# Patient Record
Sex: Male | Born: 1975 | Race: White | Hispanic: No | Smoking: Never smoker
Health system: Southern US, Community
[De-identification: ages and names within clinical notes are randomized; demographics above are authoritative.]

## PROBLEM LIST (undated history)

## (undated) DIAGNOSIS — M24419 Recurrent dislocation, unspecified shoulder: Secondary | ICD-10-CM

## (undated) HISTORY — PX: WISDOM TOOTH EXTRACTION: SHX21

---

## 2007-12-12 ENCOUNTER — Emergency Department (HOSPITAL_COMMUNITY): Admission: EM | Admit: 2007-12-12 | Discharge: 2007-12-12 | Payer: Self-pay | Admitting: Family Medicine

## 2009-12-22 ENCOUNTER — Emergency Department (HOSPITAL_BASED_OUTPATIENT_CLINIC_OR_DEPARTMENT_OTHER): Admission: EM | Admit: 2009-12-22 | Discharge: 2009-12-23 | Payer: Self-pay | Admitting: Emergency Medicine

## 2009-12-22 ENCOUNTER — Ambulatory Visit: Payer: Self-pay | Admitting: Diagnostic Radiology

## 2010-01-20 ENCOUNTER — Ambulatory Visit (HOSPITAL_COMMUNITY): Admission: RE | Admit: 2010-01-20 | Discharge: 2010-01-20 | Payer: Self-pay | Admitting: Orthopedic Surgery

## 2010-09-04 HISTORY — PX: SHOULDER ARTHROSCOPY W/ ROTATOR CUFF REPAIR: SHX2400

## 2013-06-25 ENCOUNTER — Ambulatory Visit
Admission: RE | Admit: 2013-06-25 | Discharge: 2013-06-25 | Disposition: A | Discharge status: Home / Self Care | Payer: No Typology Code available for payment source | Source: Ambulatory Visit | Attending: Occupational Medicine | Admitting: Occupational Medicine

## 2013-06-25 ENCOUNTER — Other Ambulatory Visit: Payer: Self-pay | Admitting: Occupational Medicine

## 2013-06-25 DIAGNOSIS — Z021 Encounter for pre-employment examination: Secondary | ICD-10-CM

## 2020-03-16 ENCOUNTER — Emergency Department (HOSPITAL_BASED_OUTPATIENT_CLINIC_OR_DEPARTMENT_OTHER): Payer: 59

## 2020-03-16 ENCOUNTER — Encounter (HOSPITAL_BASED_OUTPATIENT_CLINIC_OR_DEPARTMENT_OTHER): Payer: Self-pay

## 2020-03-16 ENCOUNTER — Other Ambulatory Visit: Payer: Self-pay

## 2020-03-16 ENCOUNTER — Emergency Department (HOSPITAL_BASED_OUTPATIENT_CLINIC_OR_DEPARTMENT_OTHER)
Admission: EM | Admit: 2020-03-16 | Discharge: 2020-03-16 | Disposition: A | Discharge status: Home / Self Care | Payer: 59 | Attending: Emergency Medicine | Admitting: Emergency Medicine

## 2020-03-16 DIAGNOSIS — X501XXA Overexertion from prolonged static or awkward postures, initial encounter: Secondary | ICD-10-CM | POA: Diagnosis not present

## 2020-03-16 DIAGNOSIS — Y939 Activity, unspecified: Secondary | ICD-10-CM | POA: Insufficient documentation

## 2020-03-16 DIAGNOSIS — S4992XA Unspecified injury of left shoulder and upper arm, initial encounter: Secondary | ICD-10-CM | POA: Diagnosis present

## 2020-03-16 DIAGNOSIS — Y999 Unspecified external cause status: Secondary | ICD-10-CM | POA: Diagnosis not present

## 2020-03-16 DIAGNOSIS — Y929 Unspecified place or not applicable: Secondary | ICD-10-CM | POA: Diagnosis not present

## 2020-03-16 NOTE — ED Triage Notes (Signed)
Pt believes left shoulder is dislocated ~1hr ago moving a door jam, good cap refill

## 2020-03-16 NOTE — ED Provider Notes (Signed)
[facility] EMERGENCY DEPARTMENT Provider Note  CSN: [id number] Arrival date & time: 03/16/20 0203  Chief Complaint(s) No chief complaint on file.  HPI Francis Smith is a 44 y.o. male   CC: Shoulder pain  Onset/Duration: Sudden, 2 to 3 hours ago Timing: Constant now resolved Location: Left Quality: Aching Severity: Initially severe Modifying Factors:  Improved by: Immobility  Worsened by: Range of motion palpation of the left shoulder Associated Signs/Symptoms:  Pertinent (+): Reports deformity of the left shoulder  Pertinent (-): Trauma/fall, wounds, numbness, weakness Context: Patient reports that he was pulling on a door jam when he felt the shoulder pop.  Believes it was dislocated.     HPI  Past Medical History History reviewed. No pertinent past medical history. There are no problems to display for this patient.  Home Medication(s) Prior to Admission medications   Not on File                                                                                                                                    Past Surgical History Past Surgical History:  Procedure Laterality Date  . SHOULDER ARTHROSCOPY W/ ROTATOR CUFF REPAIR Right 2012  . WISDOM TOOTH EXTRACTION     Family History History reviewed. No pertinent family history.  Social History Social History   Tobacco Use  . Smoking status: Never Smoker  . Smokeless tobacco: Current User  Substance Use Topics  . Alcohol use: Yes  . Drug use: Never   Allergies Patient has no allergy information on record.  Review of Systems Review of Systems All other systems are reviewed and are negative for acute change except as noted in the HPI  Physical Exam Vital Signs  I have reviewed the triage vital signs BP 128/77 (BP Location: Right Arm)   Pulse 67   Temp 98 F (36.7 C) (Oral)   Resp 15   Ht 6\' 2"  (1.88 m)   Wt (!) 142.4 kg   SpO2 92%   BMI 40.32 kg/m   Physical Exam Vitals  reviewed.  Constitutional:      General: He is not in acute distress.    Appearance: He is well-developed. He is not diaphoretic.  HENT:     Head: Normocephalic and atraumatic.     Jaw: No trismus.     Right Ear: External ear normal.     Left Ear: External ear normal.     Nose: Nose normal.  Eyes:     General: No scleral icterus.    Conjunctiva/sclera: Conjunctivae normal.  Neck:     Trachea: Phonation normal.  Cardiovascular:     Rate and Rhythm: Normal rate and regular rhythm.  Pulmonary:     Effort: Pulmonary effort is normal. No respiratory distress.     Breath sounds: No stridor.  Abdominal:     General: There is no distension.  Musculoskeletal:  General: Normal range of motion.     Left shoulder: No swelling, deformity, tenderness or bony tenderness. Normal range of motion. Normal strength. Normal pulse.     Cervical back: Normal range of motion.  Neurological:     Mental Status: He is alert and oriented to person, place, and time.  Psychiatric:        Behavior: Behavior normal.     ED Results and Treatments Labs (all labs ordered are listed, but only abnormal results are displayed) Labs Reviewed - No data to display                                                                                                                       EKG  EKG Interpretation  Date/Time:    Ventricular Rate:    PR Interval:    QRS Duration:   QT Interval:    QTC Calculation:   R Axis:     Text Interpretation:        Radiology DG Shoulder Left  Result Date: 03/16/2020 CLINICAL DATA:  44 year old male with trauma to the left shoulder. EXAM: LEFT SHOULDER - 2+ VIEW COMPARISON:  Chest radiograph dated 06/25/2013. FINDINGS: Small corticated appearing bone fragments adjacent to the humeral head with no obvious donor site likely represent calcification of the insertion of supraspinatus tendon or small intra-articular loose bodies. No definite acute fracture. There is no  dislocation. The bones are well mineralized. No significant arthritic changes. The soft tissues are unremarkable. IMPRESSION: 1. No definite acute fracture or dislocation. 2. Probable calcification of the insertion of supraspinatus tendon versus intra-articular loose bodies. Electronically Signed   By: Elgie Collard M.D.   On: 03/16/2020 02:49    Pertinent labs & imaging results that were available during my care of the patient were reviewed by me and considered in my medical decision making (see chart for details).  Medications Ordered in ED Medications - No data to display                                                                                                                                  Procedures Procedures  (including critical care time)  Medical Decision Making / ED Course I have reviewed the nursing notes for this encounter and the patient's prior records (if available in EHR or on provided paperwork).   Francis Smith was evaluated in Emergency Department on 03/16/2020 for the symptoms  described in the history of present illness. He was evaluated in the context of the global COVID-19 pandemic, which necessitated consideration that the patient might be at risk for infection with the SARS-CoV-2 virus that causes COVID-19. Institutional protocols and algorithms that pertain to the evaluation of patients at risk for COVID-19 are in a state of rapid change based on information released by regulatory bodies including the CDC and federal and state organizations. These policies and algorithms were followed during the patient's care in the ED.  Plain film negative for dislocation.  Possible avulsion fracture vs rotator cuff injury.  Patient already has an established orthopedic surgeon, recommended follow-up with him. Declined a sling here.      Final Clinical Impression(s) / ED Diagnoses Final diagnoses:  Injury of left shoulder, initial encounter    The patient appears  reasonably screened and/or stabilized for discharge and I doubt any other medical condition or other [facility] requiring further screening, evaluation, or treatment in the ED at this time prior to discharge. Safe for discharge with strict return precautions.  Disposition: Discharge  Condition: Good  I have discussed the results, Dx and Tx plan with the patient/family who expressed understanding and agree(s) with the plan. Discharge instructions discussed at length. The patient/family was given strict return precautions who verbalized understanding of the instructions. No further questions at time of discharge.    ED Discharge Orders    None        Follow Up: [address] [place] [address] [phone]  Schedule an appointment as soon as possible for a visit       This chart was dictated using voice recognition software.  Despite best efforts to proofread,  errors can occur which can change the documentation meaning.   Nira Conn, MD 03/16/20 [phone]

## 2020-03-26 ENCOUNTER — Emergency Department (HOSPITAL_COMMUNITY): Payer: 59

## 2020-03-26 ENCOUNTER — Emergency Department (HOSPITAL_COMMUNITY)
Admission: EM | Admit: 2020-03-26 | Discharge: 2020-03-26 | Disposition: A | Discharge status: Home / Self Care | Payer: 59 | Attending: Emergency Medicine | Admitting: Emergency Medicine

## 2020-03-26 DIAGNOSIS — S43015A Anterior dislocation of left humerus, initial encounter: Secondary | ICD-10-CM | POA: Insufficient documentation

## 2020-03-26 DIAGNOSIS — X58XXXA Exposure to other specified factors, initial encounter: Secondary | ICD-10-CM | POA: Diagnosis not present

## 2020-03-26 DIAGNOSIS — Y999 Unspecified external cause status: Secondary | ICD-10-CM | POA: Diagnosis not present

## 2020-03-26 DIAGNOSIS — Y929 Unspecified place or not applicable: Secondary | ICD-10-CM | POA: Insufficient documentation

## 2020-03-26 DIAGNOSIS — Y939 Activity, unspecified: Secondary | ICD-10-CM | POA: Insufficient documentation

## 2020-03-26 NOTE — ED Notes (Signed)
Patient verbalizes understanding of discharge instructions. Opportunity for questioning and answers were provided. Armband removed by staff, pt discharged from ED ambulatory.   

## 2020-03-26 NOTE — ED Provider Notes (Signed)
[facility] EMERGENCY DEPARTMENT Provider Note   CSN: [id number] Arrival date & time: 03/26/20  1327     History Chief Complaint  Patient presents with  . Shoulder Pain    Francis Smith is a 44 y.o. male.  The history is provided by the patient.  Shoulder Pain Location:  Shoulder (putting on his jacket today when shoulder dislocated) Shoulder location:  L shoulder Injury: no   Pain details:    Quality:  Cramping, shooting and throbbing   Radiates to:  Does not radiate   Severity:  Moderate   Onset quality:  Sudden   Duration:  2 hours   Timing:  Constant   Progression:  Worsening Handedness:  Right-handed Dislocation: yes   Prior injury to area:  Yes Relieved by:  None tried Worsened by:  Movement Ineffective treatments: tried moving the arm to get it back in but not getting better. Associated symptoms: decreased range of motion   Associated symptoms: no numbness, no stiffness and no swelling   Risk factors comment:  Hx of prior dislocation.  last dislocated 2 weeks ago      No past medical history on file.  There are no problems to display for this patient.   Past Surgical History:  Procedure Laterality Date  . SHOULDER ARTHROSCOPY W/ ROTATOR CUFF REPAIR Right 2012  . WISDOM TOOTH EXTRACTION         No family history on file.  Social History   Tobacco Use  . Smoking status: Never Smoker  . Smokeless tobacco: Current User  Substance Use Topics  . Alcohol use: Yes  . Drug use: Never    Home Medications Prior to Admission medications   Not on File    Allergies    Patient has no known allergies.  Review of Systems   Review of Systems  Musculoskeletal: Negative for stiffness.  All other systems reviewed and are negative.   Physical Exam Updated Vital Signs BP (!) 168/106   Pulse 74   Resp 20   SpO2 93%   Physical Exam Vitals and nursing note reviewed.  Constitutional:      General: He is not in acute  distress.    Appearance: Normal appearance. He is obese.  HENT:     Head: Normocephalic and atraumatic.  Eyes:     Pupils: Pupils are equal, round, and reactive to light.  Cardiovascular:     Rate and Rhythm: Normal rate.     Pulses: Normal pulses.  Pulmonary:     Effort: Pulmonary effort is normal.  Musculoskeletal:     Left shoulder: Deformity and bony tenderness present. Decreased range of motion.     Comments: Shoulder sitting lower and leaned forward  Skin:    General: Skin is warm and dry.  Neurological:     General: No focal deficit present.     Mental Status: He is alert and oriented to person, place, and time. Mental status is at baseline.  Psychiatric:        Mood and Affect: Mood normal.        Behavior: Behavior normal.     ED Results / Procedures / Treatments   Labs (all labs ordered are listed, but only abnormal results are displayed) Labs Reviewed - No data to display  EKG None  Radiology DG Shoulder Left  Result Date: 03/26/2020 CLINICAL DATA:  Possible shoulder dislocation. No mechanism of injury given. EXAM: LEFT SHOULDER - 2+ VIEW COMPARISON:  03/16/2020 FINDINGS: Anterior,  inferior dislocation of the humeral head relative to the glenoid. 2 previously demonstrated oval, corticated bone fragments are again demonstrated posteriorly. No acute fracture or dislocation seen. Mild humeral head and neck junction spur formation. Mild inferior acromioclavicular spur formation. IMPRESSION: 1. Anterior dislocation. 2. Two previously demonstrated corticated bone fragments posteriorly. Electronically Signed   By: Beckie Salts M.D.   On: 03/26/2020 14:15   DG Shoulder Left Portable  Result Date: 03/26/2020 CLINICAL DATA:  Status post reduction EXAM: LEFT SHOULDER COMPARISON:  In the same day FINDINGS: Humeral head is been reduced. No acute fracture is noted. Previously seen well corticated bony densities are less well appreciated on this exam. Underlying bony thorax is  within normal limits. IMPRESSION: Interval reduction of previously seen shoulder dislocation Electronically Signed   By: Alcide Clever M.D.   On: 03/26/2020 14:59    Procedures Reduction of dislocation  Date/Time: 03/26/2020 2:27 PM Performed by: Gwyneth Sprout, MD Authorized by: Gwyneth Sprout, MD  Local anesthesia used: no  Anesthesia: Local anesthesia used: no  Sedation: Patient sedated: no  Patient tolerance: patient tolerated the procedure well with no immediate complications Comments: Pt laid prone and with constant traction and shoulder flexion pt relaxed and pop occurred with resolution of pain and return of ROM    (including critical care time)  Medications Ordered in ED Medications - No data to display  ED Course  I have reviewed the triage vital signs and the nursing notes.  Pertinent labs & imaging results that were available during my care of the patient were reviewed by me and considered in my medical decision making (see chart for details).    MDM Rules/Calculators/A&P                          Patient presenting today with recurrent shoulder dislocation.  Patient was just putting on a jacket today when it went out but he was unable to get it back in and is having ongoing pain in his left shoulder.  Patient has had multiple dislocations of the shoulder in the past the last being 2 weeks ago when he jerked on something but was able to get it in at home.  Patient has no neurologic complaints at this time.  X-ray shows anterior dislocation with 2 previously corticated bone fragments posteriorly.  Patient was laid prone and able to reduce at bedside without sedation or pain medication.  Patient is moving his arm through range of motion and reports he feels much better.  We will get postreduction imaging.  3:05 PM Reduction images wnl.  Will d/c home.  MDM Number of Diagnoses or Management Options   Amount and/or Complexity of Data Reviewed Tests in the  radiology section of CPT: ordered and reviewed Independent visualization of images, tracings, or specimens: yes  Risk of Complications, Morbidity, and/or Mortality Presenting problems: moderate Diagnostic procedures: low Management options: low  Patient Progress Patient progress: improved   Final Clinical Impression(s) / ED Diagnoses Final diagnoses:  Anterior dislocation of left shoulder, initial encounter    Rx / DC Orders ED Discharge Orders    None       Gwyneth Sprout, MD 03/26/20 1506

## 2020-03-26 NOTE — Discharge Instructions (Signed)
Avoid lifting your arm directly up from your side like putting on a jacket for the next 2 weeks to prevent recurrent dislocation

## 2020-03-26 NOTE — ED Triage Notes (Signed)
Pt arrives with possible dislocated L shoulder. CNS intact.

## 2020-03-30 ENCOUNTER — Emergency Department (HOSPITAL_COMMUNITY): Payer: 59

## 2020-03-30 ENCOUNTER — Emergency Department (HOSPITAL_COMMUNITY)
Admission: EM | Admit: 2020-03-30 | Discharge: 2020-03-30 | Disposition: A | Discharge status: Home / Self Care | Payer: 59 | Attending: Emergency Medicine | Admitting: Emergency Medicine

## 2020-03-30 ENCOUNTER — Other Ambulatory Visit: Payer: Self-pay

## 2020-03-30 ENCOUNTER — Encounter (HOSPITAL_COMMUNITY): Payer: Self-pay | Admitting: Obstetrics and Gynecology

## 2020-03-30 DIAGNOSIS — X501XXA Overexertion from prolonged static or awkward postures, initial encounter: Secondary | ICD-10-CM | POA: Insufficient documentation

## 2020-03-30 DIAGNOSIS — Y929 Unspecified place or not applicable: Secondary | ICD-10-CM | POA: Insufficient documentation

## 2020-03-30 DIAGNOSIS — Y999 Unspecified external cause status: Secondary | ICD-10-CM | POA: Diagnosis not present

## 2020-03-30 DIAGNOSIS — Y939 Activity, unspecified: Secondary | ICD-10-CM | POA: Insufficient documentation

## 2020-03-30 DIAGNOSIS — S43015A Anterior dislocation of left humerus, initial encounter: Secondary | ICD-10-CM | POA: Insufficient documentation

## 2020-03-30 DIAGNOSIS — S4992XA Unspecified injury of left shoulder and upper arm, initial encounter: Secondary | ICD-10-CM | POA: Diagnosis present

## 2020-03-30 HISTORY — DX: Recurrent dislocation, unspecified shoulder: M24.419

## 2020-03-30 MED ORDER — OXYCODONE-ACETAMINOPHEN 5-325 MG PO TABS
1.0000 | ORAL_TABLET | Freq: Once | ORAL | Status: AC
  Administered 2020-03-30: 1 via ORAL
  Filled 2020-03-30: qty 1

## 2020-03-30 NOTE — ED Notes (Signed)
Spoke with ortho, per ortho pt was seen by there PTA, 4cc lidocaine injected into shoulder and unable to reset at that time.

## 2020-03-30 NOTE — ED Notes (Signed)
Pt discharged from this ED in stable condition at this time. All discharge instructions and follow up care reviewed with pt with no further questions at this time. Pt ambulatory with steady gait, clear speech.  

## 2020-03-30 NOTE — ED Provider Notes (Signed)
[facility]-EMERGENCY DEPT Provider Note   CSN: [id number] Arrival date & time: 03/30/20  0849     History Chief Complaint  Patient presents with  . Shoulder Injury    TOMOKI LUCKEN is a 44 y.o. male.  Presents to ER with concern for shoulder injury.  Patient states this is now his third time dislocating his shoulder over the last couple weeks.  Thinks it was dislocated this morning and sleepy.  Went to [facility] this morning, they were unsuccessful in reducing the shoulder and sent to ER for further evaluation.  Currently having moderate pain, worse with movement, no numbness or weakness.  No other relieving aggravating factors.  Has not taken meds yet.  HPI     Past Medical History:  Diagnosis Date  . Shoulder dislocation, recurrent     There are no problems to display for this patient.   Past Surgical History:  Procedure Laterality Date  . SHOULDER ARTHROSCOPY W/ ROTATOR CUFF REPAIR Right 2012  . WISDOM TOOTH EXTRACTION         No family history on file.  Social History   Tobacco Use  . Smoking status: Never Smoker  . Smokeless tobacco: Current User  Substance Use Topics  . Alcohol use: Yes  . Drug use: Never    Home Medications Prior to Admission medications   Not on File    Allergies    Patient has no known allergies.  Review of Systems   Review of Systems  Constitutional: Negative for chills and fever.  HENT: Negative for ear pain and sore throat.   Eyes: Negative for pain and visual disturbance.  Respiratory: Negative for cough and shortness of breath.   Cardiovascular: Negative for chest pain and palpitations.  Gastrointestinal: Negative for abdominal pain and vomiting.  Genitourinary: Negative for dysuria and hematuria.  Musculoskeletal: Positive for arthralgias. Negative for back pain.  Skin: Negative for color change and rash.  Neurological: Negative for seizures and syncope.  All other systems reviewed and are  negative.    Physical Exam Updated Vital Signs BP (!) 188/125   Pulse 72   Temp 98.4 F (36.9 C) (Oral)   Resp 16   SpO2 99%   Physical Exam Vitals and nursing note reviewed.  HENT:     Head: Normocephalic and atraumatic.     Nose: Nose normal.     Mouth/Throat:     Mouth: Mucous membranes are moist.  Eyes:     Pupils: Pupils are equal, round, and reactive to light.  Cardiovascular:     Rate and Rhythm: Normal rate and regular rhythm.     Pulses: Normal pulses.  Pulmonary:     Effort: Pulmonary effort is normal. No respiratory distress.  Abdominal:     General: Abdomen is flat.  Musculoskeletal:     Cervical back: Neck supple. No rigidity.     Comments: LUE: Left shoulder has obvious deformity, there is TTP throughout shoulder, ROM limited, sensation distal motor intact, normal radial pulse  Neurological:     Mental Status: He is alert.     ED Results / Procedures / Treatments   Labs (all labs ordered are listed, but only abnormal results are displayed) Labs Reviewed - No data to display  EKG None  Radiology CT SHOULDER LEFT WO CONTRAST  Result Date: 03/30/2020 CLINICAL DATA:  Shoulder dislocation post reduction. History of recurrent dislocations. EXAM: CT OF THE UPPER LEFT EXTREMITY WITHOUT CONTRAST TECHNIQUE: Multidetector CT imaging of the upper  left extremity was performed according to the standard protocol. COMPARISON:  Chest x-ray 03/30/2020 FINDINGS: The humeral head is normally located in the glenoid fossa. No acute fracture is identified. There is a shallow Hill-Sachs impaction type defect involving the humeral head which appears chronic. No bony Bankart fracture of the glenoid is identified. No significant degenerative changes involving the glenohumeral joint. Corticated densities are noted along the posterior aspect of the humeral head. These are most likely calcifications in the infraspinatus tendon suggesting calcific tendinitis. The [facility] joint is intact. Os  acromial noted. The acromion is type 1-2 in shape. No significant lateral downsloping or undersurface spurring. Mild narrowing of the humeroacromial space. Suspect supraspinatus and infraspinatus tendon tears with mild fatty atrophy of the muscles. MRI suggested for further evaluation. The left ribs are intact and the visualized left lung is grossly clear. The heart appears enlarged and there are age advanced coronary artery calcifications noted. IMPRESSION: 1. 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 No bony Bankart fracture of the glenoid is identified. 2. Corticated densities along the posterior aspect of the humeral head are most likely calcifications in the infraspinatus tendon suggesting calcific tendinitis. 3. 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 MRI suggested for further evaluation. 4. Age advanced coronary artery calcifications. Aortic Atherosclerosis (ICD10-I70.0). Electronically Signed   By: Rudie Meyer M.D.   On: 03/30/2020 11:01   DG Shoulder Left  Result Date: 03/30/2020 CLINICAL DATA:  Left shoulder dislocation status post reduction EXAM: LEFT SHOULDER - 2+ VIEW COMPARISON:  03/30/2020, 03/26/2020, 03/16/2020 FINDINGS: Interval reduction of left glenohumeral joint. Two ovoid mineralized densities seen adjacent to the greater tuberosity were present on previous study 03/16/2020 which may reflect prior fracture fragments or rotator cuff calcific tendinopathy. Possible additional osseous density adjacent to the posterior glenoid rim seen on AP view, indeterminate. Osseous structures appear otherwise intact. Soft tissues within normal limits. IMPRESSION: 1. Interval reduction of left glenohumeral joint, now in anatomic alignment. 2. Chronic ovoid mineralized densities adjacent to the greater tuberosity, which may reflect prior fracture fragments or rotator cuff calcific tendinopathy. 3. Additional  indeterminate bony density projects adjacent to the posterior glenoid rim. Electronically Signed   By: Duanne Guess D.O.   On: 03/30/2020 10:01   DG Shoulder Left  Result Date: 03/30/2020 CLINICAL DATA:  Shoulder dislocation EXAM: LEFT SHOULDER - 2+ VIEW COMPARISON:  03/26/2020 FINDINGS: Anterior dislocation at the left glenohumeral joint. Again seen are corticated bone fragments posterior to the glenoid, stable since prior study. AC joint is intact. IMPRESSION: Recurrent left glenohumeral anterior dislocation. Electronically Signed   By: Charlett Nose M.D.   On: 03/30/2020 09:23    Procedures Reduction of dislocation  Date/Time: 03/30/2020 10:34 AM Performed by: Milagros Loll, MD Authorized by: Milagros Loll, MD  Local anesthesia used: no  Anesthesia: Local anesthesia used: no  Sedation: Patient sedated: no  Patient tolerance: patient tolerated the procedure well with no immediate complications Comments: Reviewed risk benefits with patient, patient provided verbal consent.  Timeout performed.  Utilized Cunningham technique for shoulder reduction, patient's left hand was placed on my left shoulder, subsequently provided steady, gentle downward traction patient's left arm at his elbow, concurrently performed biceps and scapular massage. Shoulder dislocation was successfully reduced, deformity resolved and pain resolved. Post procedure, sensation and radial pulse intact. Post procedure xray confirmed reduction.    (including critical care time)  Medications Ordered in ED Medications  oxyCODONE-acetaminophen (PERCOCET/ROXICET) 5-325  MG per tablet 1 tablet (1 tablet Oral Given 03/30/20 0915)    ED Course  I have reviewed the triage vital signs and the nursing notes.  Pertinent labs & imaging results that were available during my care of the patient were reviewed by me and considered in my medical decision making (see chart for details).    MDM Rules/Calculators/A&P                          44 year old male presents ER with recurrent left shoulder dislocation.  Successful reduction in ER without conscious sedation.  Used Cunningham technique, 1 attempt.  Neurovascularly intact pre and post procedure.  Ortho PA ordered CT shoulder for further eval.  Patient will need close follow-up in the Ortho clinic.  Patient reports Ortho already discussing plan for operative management in the outpatient setting.  Recommended sling, nonweightbearing until further instructed by Ortho.  After the discussed management above, the patient was determined to be safe for discharge.  The patient was in agreement with this plan and all questions regarding their care were answered.  ED return precautions were discussed and the patient will return to the ED with any significant worsening of condition.   Final Clinical Impression(s) / ED Diagnoses Final diagnoses:  Anterior dislocation of left shoulder, initial encounter    Rx / DC Orders ED Discharge Orders    None       Milagros Loll, MD 03/30/20 1414

## 2020-03-30 NOTE — Discharge Instructions (Signed)
Go to your orthopedic doctor as discussed.  Keep your arm in sling and immobilized as discussed.  If you develop recurrent dislocation, sudden onset of pain, any numbness, weakness, other new concerning symptom, return to ER for reassessment.  Take Tylenol Motrin as needed for pain control.  No weightbearing until you have been cleared by Ortho.

## 2020-03-30 NOTE — ED Triage Notes (Signed)
Patient reports this is the 3rd time he has dislocated his shoulder in 1 week. [facility] reports [facility] and they were unable to pop it back in this time. Patient was sent here for eval. Patient is attempting to hold his shoulder in place

## 2020-04-18 ENCOUNTER — Emergency Department (HOSPITAL_COMMUNITY): Payer: 59

## 2020-04-18 ENCOUNTER — Emergency Department (HOSPITAL_COMMUNITY)
Admission: EM | Admit: 2020-04-18 | Discharge: 2020-04-18 | Disposition: A | Discharge status: Home / Self Care | Payer: 59 | Attending: Emergency Medicine | Admitting: Emergency Medicine

## 2020-04-18 ENCOUNTER — Other Ambulatory Visit: Payer: Self-pay

## 2020-04-18 ENCOUNTER — Encounter (HOSPITAL_COMMUNITY): Payer: Self-pay

## 2020-04-18 DIAGNOSIS — Y999 Unspecified external cause status: Secondary | ICD-10-CM | POA: Insufficient documentation

## 2020-04-18 DIAGNOSIS — S43015A Anterior dislocation of left humerus, initial encounter: Secondary | ICD-10-CM | POA: Diagnosis not present

## 2020-04-18 DIAGNOSIS — Y92003 Bedroom of unspecified non-institutional (private) residence as the place of occurrence of the external cause: Secondary | ICD-10-CM | POA: Insufficient documentation

## 2020-04-18 DIAGNOSIS — X501XXA Overexertion from prolonged static or awkward postures, initial encounter: Secondary | ICD-10-CM | POA: Insufficient documentation

## 2020-04-18 DIAGNOSIS — S4992XA Unspecified injury of left shoulder and upper arm, initial encounter: Secondary | ICD-10-CM | POA: Diagnosis present

## 2020-04-18 DIAGNOSIS — Y9384 Activity, sleeping: Secondary | ICD-10-CM | POA: Diagnosis not present

## 2020-04-18 MED ORDER — LIDOCAINE HCL (PF) 1 % IJ SOLN
20.0000 mL | Freq: Once | INTRAMUSCULAR | Status: AC
  Administered 2020-04-18: 20 mL
  Filled 2020-04-18: qty 30

## 2020-04-18 MED ORDER — HYDROMORPHONE HCL 1 MG/ML IJ SOLN
1.0000 mg | Freq: Once | INTRAMUSCULAR | Status: DC
  Filled 2020-04-18: qty 1

## 2020-04-18 MED ORDER — HYDROMORPHONE HCL 1 MG/ML IJ SOLN
1.0000 mg | Freq: Once | INTRAMUSCULAR | Status: AC
  Administered 2020-04-18: 1 mg via INTRAMUSCULAR
  Filled 2020-04-18: qty 1

## 2020-04-18 NOTE — ED Provider Notes (Signed)
[facility]-EMERGENCY DEPT Provider Note   CSN: [id number] Arrival date & time: 04/18/20  4315     History Chief Complaint  Patient presents with  . Shoulder Pain    Francis Smith is a 44 y.o. male.  HPI Patient has had multiple prior shoulder dislocations.  He is working with orthopedics and has had an MRI done yesterday.  He just rolled over in the bed this morning and his left shoulder popped out of place.  He reports that occurred just prior to arrival.  On occasion he has been able to spontaneously have it relocated on itself after dislocating.  Today it did not go back in and it severely painful with any movement.  Patient of comfort is at his side and was elbow slightly flexed.  Numbness or tingling into the hand.  Patient is otherwise been well without fever chills or cough.  Patient reports he has had it successfully reduced in the emergency department without sedation previously.    Past Medical History:  Diagnosis Date  . Shoulder dislocation, recurrent     There are no problems to display for this patient.   Past Surgical History:  Procedure Laterality Date  . SHOULDER ARTHROSCOPY W/ ROTATOR CUFF REPAIR Right 2012  . WISDOM TOOTH EXTRACTION         History reviewed. No pertinent family history.  Social History   Tobacco Use  . Smoking status: Never Smoker  . Smokeless tobacco: Current User  Substance Use Topics  . Alcohol use: Yes  . Drug use: Never    Home Medications Prior to Admission medications   Not on File    Allergies    Patient has no known allergies.  Review of Systems   Review of Systems Constitutional: No fever no chills no malaise Respiratory: No cough no shortness of breath Neurologic: No weakness numbness or tingling. Physical Exam Updated Vital Signs BP (!) 149/101   Pulse 71   Temp 98.1 F (36.7 C) (Oral)   Resp 18   SpO2 99%   Physical Exam Constitutional:      Comments: Patient is alert and  nontoxic.  No respiratory distress.  Patient in pain holding left shoulder in adduction and flexion  HENT:     Head: Normocephalic and atraumatic.  Eyes:     Extraocular Movements: Extraocular movements intact.  Cardiovascular:     Rate and Rhythm: Normal rate and regular rhythm.  Pulmonary:     Effort: Pulmonary effort is normal.     Breath sounds: Normal breath sounds.  Musculoskeletal:     Comments: Right upper extremity normal range of motion.  Left upper extremity subluxation appearing deformity at the left shoulder.  Grip strength intact on the right.  Radial pulse 2+ and strong.  Patient neurovascularly intact.  Skin:    General: Skin is warm and dry.  Neurological:     General: No focal deficit present.     Mental Status: He is oriented to person, place, and time.     Coordination: Coordination normal.  Psychiatric:        Mood and Affect: Mood normal.     ED Results / Procedures / Treatments   Labs (all labs ordered are listed, but only abnormal results are displayed) Labs Reviewed - No data to display  EKG None  Radiology DG Shoulder Left  Result Date: 04/18/2020 CLINICAL DATA:  Shoulder dislocation EXAM: LEFT SHOULDER - 2+ VIEW COMPARISON:  03/30/2020 FINDINGS: Anterior inferior dislocation of  the humeral head is noted. Small well corticated bony density is noted adjacent to the glenoid stable in appearance from prior exam. IMPRESSION: Anterior inferior dislocation of the left humeral head. Electronically Signed   By: Alcide Clever M.D.   On: 04/18/2020 10:11   DG Shoulder Left Portable  Result Date: 04/18/2020 CLINICAL DATA:  Post reduction left shoulder dislocation. EXAM: LEFT SHOULDER COMPARISON:  Earlier same day. FINDINGS: Normal alignment post reduction of patient's anterior shoulder dislocation. Minimal degenerate change of the glenohumeral joint and AC joints. Remainder of the exam is unchanged. IMPRESSION: Normal alignment post reduction of patient's anterior  shoulder dislocation. Electronically Signed   By: Elberta Fortis M.D.   On: 04/18/2020 13:06    Procedures .Ortho Injury Treatment  Date/Time: 04/18/2020 1:14 PM Performed by: Arby Barrette, MD Authorized by: Arby Barrette, MD   Consent:    Consent obtained:  Verbal   Consent given by:  Patient   Risks discussed:  Fracture, nerve damage, restricted joint movement, stiffness, recurrent dislocation and irreducible dislocationInjury location: shoulder Location details: left shoulder Injury type: dislocation Dislocation type: anterior Hill-Sachs deformity: no Chronicity: recurrent Pre-procedure neurovascular assessment: neurovascularly intact Pre-procedure distal perfusion: normal Pre-procedure neurological function: normal Pre-procedure range of motion: reduced  Patient sedated: NoManipulation performed: yes Reduction method: scapular manipulation, Milch technique, external rotation, Stimson maneuver and traction and counter traction Reduction successful: yes X-ray confirmed reduction: yes Post-procedure distal perfusion: normal Post-procedure neurological function: normal Post-procedure range of motion: normal Patient tolerance: patient tolerated the procedure well with no immediate complications Comments: Multiple techniques used to reduce shoulder without sedation.  Patient's preference was for reduction without sedation.  Patient was given 1 IM shot of Dilaudid.  Also joint injection with 1% lidocaine by PA-C Fondlaw. First attempt reduction done in seated position with external rotation maneuver followed by Milch technique.  This did not result in reduction of the shoulder and patient developed significant pain with abduction and external rotation above 130 degrees.  Had to discontinue procedure. Patient subsequently placed in prone position with 5 pound weight.  After approximately 30 minutes of traction with weight, PA-C Fondlaw applied increased traction with some external  rotation while I perform scapular rotation.  Patient's shoulder ultimately reduced.  Procedure patient was much more comfortable and able to perform range of motion again.    (including critical care time)  Medications Ordered in ED Medications  lidocaine (PF) (XYLOCAINE) 1 % injection 20 mL (has no administration in time range)  HYDROmorphone (DILAUDID) injection 1 mg (has no administration in time range)  HYDROmorphone (DILAUDID) injection 1 mg (1 mg Intramuscular Given 04/18/20 1008)    ED Course  I have reviewed the triage vital signs and the nursing notes.  Pertinent labs & imaging results that were available during my care of the patient were reviewed by me and considered in my medical decision making (see chart for details).    MDM Rules/Calculators/A&P                          Patient with multiple dislocations of left shoulder.  Today, it dislocated spontaneously rolling over in bed.  No other complaints.  No other injury.  Patient was neurovascularly intact upon arrival.  Patient wished for reduction without general sedation.  Procedures performed as outlined in procedure note.  Ultimately able to reduce without sedation.  Patient has follow-up with orthopedics in the next 3 days. Final Clinical Impression(s) / ED Diagnoses Final diagnoses:  Anterior dislocation of left shoulder, initial encounter    Rx / DC Orders ED Discharge Orders    None       Arby Barrette, MD 04/18/20 1329

## 2020-04-18 NOTE — Progress Notes (Signed)
Orthopedic Tech Progress Note Patient Details:  Francis Smith [birth year] [id number] Operator called to come in for on call ED need this morning. Patient lying prone with LUE hanging. Tied 5 lb weight around patient's wrist for shoulder traction as PA requested.  Musculoskeletal Traction Type of Traction:  (Shoulder Traction) Traction Location: LUE Traction Weight: 5 lbs   Post Interventions Patient Tolerated: Well Instructions Provided: Adjustment of device   Kerry Fort 04/18/2020, 1:48 PM

## 2020-04-18 NOTE — Discharge Instructions (Signed)
1.  Follow-up with your orthopedic doctor on Wednesday as planned. 2.  Avoid any overhead reaching, throwing or activities you know to dislocate your shoulder. 3.  Take ibuprofen as needed for pain.

## 2020-04-18 NOTE — ED Triage Notes (Signed)
Pt arrives POV from home with complaints of left shoulder pain. Pt reports that his shoulder is dislocated. Pt has hx of the same. Pt had MRI yesterday.

## 2020-04-18 NOTE — ED Provider Notes (Signed)
Injection of joint  Date/Time: 04/18/2020 12:40 PM Performed by: Gailen Shelter, PA Authorized by: Gailen Shelter, PA  Consent: Verbal consent obtained. Risks and benefits: risks, benefits and alternatives were discussed Consent given by: patient Patient understanding: patient states understanding of the procedure being performed Patient consent: the patient's understanding of the procedure matches consent given Relevant documents: relevant documents present and verified Test results: test results available and properly labeled Imaging studies: imaging studies available Patient identity confirmed: verbally with patient and arm band Preparation: Patient was prepped and draped in the usual sterile fashion. Local anesthesia used: yes  Anesthesia: Local anesthesia used: yes Local Anesthetic: lidocaine 1% without epinephrine Anesthetic total: 1 mL  Sedation: Patient sedated: no  Patient tolerance: patient tolerated the procedure well with no immediate complications Comments: 10 mL of 1% without epinephrine lidocaine placed in subacromial bursa via a posterior shoulder injection.  Patient was prepped with chlorhexidine and sterile technique was used with sterile gloves and drape.  Patient tolerated procedure well.     Joint injection was done by myself.  Please see Dr. Orson Gear note for full patient care note.    Solon Augusta [employer], [place] 04/18/20 1241    Arby Barrette, MD 05/05/20 [phone]

## 2020-04-18 NOTE — ED Provider Notes (Signed)
Reduction of left shoulder was assisted by myself.  I assisted Dr. Clarice Pole in reduction and relocation of dislocated left shoulder.   Solon Augusta [place], [place] 04/18/20 1318    Arby Barrette, MD 05/05/20 [phone]

## 2021-01-13 ENCOUNTER — Encounter (HOSPITAL_BASED_OUTPATIENT_CLINIC_OR_DEPARTMENT_OTHER): Payer: Self-pay

## 2021-01-13 ENCOUNTER — Other Ambulatory Visit: Payer: Self-pay

## 2021-01-13 ENCOUNTER — Emergency Department (HOSPITAL_BASED_OUTPATIENT_CLINIC_OR_DEPARTMENT_OTHER): Payer: 59

## 2021-01-13 ENCOUNTER — Inpatient Hospital Stay (HOSPITAL_BASED_OUTPATIENT_CLINIC_OR_DEPARTMENT_OTHER)
Admission: EM | Admit: 2021-01-13 | Discharge: 2021-01-15 | DRG: 433 | Disposition: A | Discharge status: Home / Self Care | Payer: 59 | Attending: Family Medicine | Admitting: Family Medicine

## 2021-01-13 DIAGNOSIS — Z720 Tobacco use: Secondary | ICD-10-CM

## 2021-01-13 DIAGNOSIS — K7010 Alcoholic hepatitis without ascites: Principal | ICD-10-CM | POA: Diagnosis present

## 2021-01-13 DIAGNOSIS — K5900 Constipation, unspecified: Secondary | ICD-10-CM | POA: Diagnosis not present

## 2021-01-13 DIAGNOSIS — R1084 Generalized abdominal pain: Secondary | ICD-10-CM | POA: Diagnosis not present

## 2021-01-13 DIAGNOSIS — K760 Fatty (change of) liver, not elsewhere classified: Secondary | ICD-10-CM | POA: Diagnosis present

## 2021-01-13 DIAGNOSIS — R161 Splenomegaly, not elsewhere classified: Secondary | ICD-10-CM | POA: Diagnosis present

## 2021-01-13 DIAGNOSIS — N179 Acute kidney failure, unspecified: Secondary | ICD-10-CM | POA: Diagnosis present

## 2021-01-13 DIAGNOSIS — K219 Gastro-esophageal reflux disease without esophagitis: Secondary | ICD-10-CM | POA: Diagnosis present

## 2021-01-13 DIAGNOSIS — K8020 Calculus of gallbladder without cholecystitis without obstruction: Secondary | ICD-10-CM | POA: Diagnosis present

## 2021-01-13 DIAGNOSIS — R17 Unspecified jaundice: Secondary | ICD-10-CM

## 2021-01-13 DIAGNOSIS — Z20822 Contact with and (suspected) exposure to covid-19: Secondary | ICD-10-CM | POA: Diagnosis not present

## 2021-01-13 DIAGNOSIS — F1010 Alcohol abuse, uncomplicated: Secondary | ICD-10-CM | POA: Diagnosis present

## 2021-01-13 DIAGNOSIS — Z6841 Body Mass Index (BMI) 40.0 and over, adult: Secondary | ICD-10-CM | POA: Diagnosis not present

## 2021-01-13 DIAGNOSIS — R7989 Other specified abnormal findings of blood chemistry: Secondary | ICD-10-CM

## 2021-01-13 LAB — COMPREHENSIVE METABOLIC PANEL
ALT: 350 U/L — ABNORMAL HIGH (ref 0–44)
AST: 155 U/L — ABNORMAL HIGH (ref 15–41)
Albumin: 4.4 g/dL (ref 3.5–5.0)
Alkaline Phosphatase: 62 U/L (ref 38–126)
Anion gap: 10 (ref 5–15)
BUN: 18 mg/dL (ref 6–20)
CO2: 26 mmol/L (ref 22–32)
Calcium: 9.3 mg/dL (ref 8.9–10.3)
Chloride: 101 mmol/L (ref 98–111)
Creatinine, Ser: 1.33 mg/dL — ABNORMAL HIGH (ref 0.61–1.24)
GFR, Estimated: >60 mL/min (ref >60)
Glucose, Bld: 110 mg/dL — ABNORMAL HIGH (ref 70–99)
Potassium: 3.7 mmol/L (ref 3.5–5.1)
Sodium: 137 mmol/L (ref 135–145)
Total Bilirubin: 7.5 mg/dL — ABNORMAL HIGH (ref 0.3–1.2)
Total Protein: 7.7 g/dL (ref 6.5–8.1)

## 2021-01-13 LAB — URINALYSIS, ROUTINE W REFLEX MICROSCOPIC
Glucose, UA: 50 mg/dL — AB
Ketones, ur: 5 mg/dL — AB
Leukocytes,Ua: NEGATIVE
Nitrite: NEGATIVE
Protein, ur: 100 mg/dL — AB
Specific Gravity, Urine: 1.03 (ref 1.005–1.030)
pH: 5 (ref 5.0–8.0)

## 2021-01-13 LAB — CBC WITH DIFFERENTIAL/PLATELET
Abs Immature Granulocytes: 0.03 10*3/uL (ref 0.00–0.07)
Basophils Absolute: 0 10*3/uL (ref 0.0–0.1)
Basophils Relative: 0 %
Eosinophils Absolute: 0.1 10*3/uL (ref 0.0–0.5)
Eosinophils Relative: 1 %
HCT: 48 % (ref 39.0–52.0)
Hemoglobin: 16.7 g/dL (ref 13.0–17.0)
Immature Granulocytes: 0 %
Lymphocytes Relative: 4 %
Lymphs Abs: 0.3 10*3/uL — ABNORMAL LOW (ref 0.7–4.0)
MCH: 31.1 pg (ref 26.0–34.0)
MCHC: 34.8 g/dL (ref 30.0–36.0)
MCV: 89.4 fL (ref 80.0–100.0)
Monocytes Absolute: 0.3 10*3/uL (ref 0.1–1.0)
Monocytes Relative: 4 %
Neutro Abs: 6.5 10*3/uL (ref 1.7–7.7)
Neutrophils Relative %: 91 %
Platelets: 121 10*3/uL — ABNORMAL LOW (ref 150–400)
RBC: 5.37 MIL/uL (ref 4.22–5.81)
RDW: 12.6 % (ref 11.5–15.5)
WBC: 7.2 10*3/uL (ref 4.0–10.5)
nRBC: 0 % (ref 0.0–0.2)

## 2021-01-13 LAB — HEPATITIS PANEL, ACUTE
HCV Ab: NONREACTIVE
Hep A IgM: NONREACTIVE
Hep B C IgM: NONREACTIVE
Hepatitis B Surface Ag: NONREACTIVE

## 2021-01-13 LAB — PROTIME-INR
INR: 1.2 (ref 0.8–1.2)
Prothrombin Time: 15.1 seconds (ref 11.4–15.2)

## 2021-01-13 LAB — BILIRUBIN, FRACTIONATED(TOT/DIR/INDIR)
Bilirubin, Direct: 3.3 mg/dL — ABNORMAL HIGH (ref 0.0–0.2)
Indirect Bilirubin: 1.9 mg/dL — ABNORMAL HIGH (ref 0.3–0.9)
Total Bilirubin: 5.2 mg/dL — ABNORMAL HIGH (ref 0.3–1.2)

## 2021-01-13 LAB — RESP PANEL BY RT-PCR (FLU A&B, COVID) ARPGX2
Influenza A by PCR: NEGATIVE
Influenza B by PCR: NEGATIVE
SARS Coronavirus 2 by RT PCR: NEGATIVE

## 2021-01-13 LAB — PHOSPHORUS: Phosphorus: 2.5 mg/dL (ref 2.5–4.6)

## 2021-01-13 LAB — LIPASE, BLOOD: Lipase: 32 U/L (ref 11–51)

## 2021-01-13 LAB — MAGNESIUM: Magnesium: 2.2 mg/dL (ref 1.7–2.4)

## 2021-01-13 MED ORDER — FOLIC ACID 1 MG PO TABS
1.0000 mg | ORAL_TABLET | Freq: Every day | ORAL | Status: DC
  Administered 2021-01-13 – 2021-01-15 (×2): 1 mg via ORAL
  Filled 2021-01-13 (×2): qty 1

## 2021-01-13 MED ORDER — PANTOPRAZOLE SODIUM 40 MG PO TBEC
40.0000 mg | DELAYED_RELEASE_TABLET | Freq: Every day | ORAL | Status: DC
  Administered 2021-01-13 – 2021-01-15 (×2): 40 mg via ORAL
  Filled 2021-01-13 (×2): qty 1

## 2021-01-13 MED ORDER — FENTANYL CITRATE (PF) 100 MCG/2ML IJ SOLN
50.0000 ug | Freq: Once | INTRAMUSCULAR | Status: AC
  Administered 2021-01-13: 50 ug via INTRAVENOUS
  Filled 2021-01-13: qty 2

## 2021-01-13 MED ORDER — LORAZEPAM 2 MG/ML IJ SOLN: 1.0000 mg | INTRAMUSCULAR | Status: DC | PRN

## 2021-01-13 MED ORDER — SODIUM CHLORIDE 0.9 % IV SOLN: INTRAVENOUS | Status: DC

## 2021-01-13 MED ORDER — ACETAMINOPHEN 325 MG PO TABS
650.0000 mg | ORAL_TABLET | Freq: Four times a day (QID) | ORAL | Status: DC | PRN
  Administered 2021-01-13: 650 mg via ORAL
  Filled 2021-01-13: qty 2

## 2021-01-13 MED ORDER — THIAMINE HCL 100 MG/ML IJ SOLN: 100.0000 mg | Freq: Every day | INTRAMUSCULAR | Status: DC

## 2021-01-13 MED ORDER — ADULT MULTIVITAMIN W/MINERALS CH
1.0000 | ORAL_TABLET | Freq: Every day | ORAL | Status: DC
  Administered 2021-01-13 – 2021-01-15 (×2): 1 via ORAL
  Filled 2021-01-13 (×2): qty 1

## 2021-01-13 MED ORDER — THIAMINE HCL 100 MG PO TABS
100.0000 mg | ORAL_TABLET | Freq: Every day | ORAL | Status: DC
  Administered 2021-01-13 – 2021-01-15 (×2): 100 mg via ORAL
  Filled 2021-01-13 (×2): qty 1

## 2021-01-13 MED ORDER — IOHEXOL 9 MG/ML PO SOLN
500.0000 mL | Freq: Once | ORAL | Status: AC
  Administered 2021-01-13: 500 mL via ORAL

## 2021-01-13 MED ORDER — ACETAMINOPHEN 650 MG RE SUPP: 650.0000 mg | Freq: Four times a day (QID) | RECTAL | Status: DC | PRN

## 2021-01-13 MED ORDER — LORAZEPAM 1 MG PO TABS: 1.0000 mg | ORAL_TABLET | ORAL | Status: DC | PRN

## 2021-01-13 MED ORDER — MAGNESIUM HYDROXIDE 400 MG/5ML PO SUSP: 30.0000 mL | Freq: Every day | ORAL | Status: DC | PRN

## 2021-01-13 MED ORDER — DOCUSATE SODIUM 100 MG PO CAPS
100.0000 mg | ORAL_CAPSULE | Freq: Two times a day (BID) | ORAL | Status: DC
  Administered 2021-01-13 – 2021-01-15 (×3): 100 mg via ORAL
  Filled 2021-01-13 (×3): qty 1

## 2021-01-13 NOTE — H&P (Signed)
History and Physical    Francis Smith XBM:[id number] DOB: 1976-04-02 DOA: 01/13/2021  PCP: Patient, No Pcp Per (Inactive)  Patient coming from: Home  Chief Complaint: Liver numbers were elevated  HPI: Francis Smith is a 45 y.o. male with medical history significant of GERD. Presenting with elevated LFTs and jaundice. He reports that he had epigastric abdominal pain 4 days ago. It was sudden onset, during eating. He thought it was gas pain. He tried some gas x and it went away. He had another episode a couple of days later. So he saw his PCP yesterday. Lab work was drawn and he was sent home. He woke this morning with chills and shakes. He spoke with his PCP, who informed him that his LFTs were elevated. He was referred to the ED for additional evaluation. He denies any other aggravating or alleviating factors.   ED Course: In the ED, his AST and ALT were elevated. His INR was 1.2. His t bili was 7.5. CT ab/pelvis only showed hepatic steatosis and splenomegaly. [facility] was consulted. [facility] was called for admission.    Review of Systems:  Denies CP, palpitations, abdominal pain, N/V/D. Reports constipation, dark urine. Review of systems is otherwise negative for all not mentioned in HPI.   PMHx Past Medical History:  Diagnosis Date  . Shoulder dislocation, recurrent     PSHx Past Surgical History:  Procedure Laterality Date  . SHOULDER ARTHROSCOPY W/ ROTATOR CUFF REPAIR Right 2012  . WISDOM TOOTH EXTRACTION      SocHx  reports that he has never smoked. He uses smokeless tobacco. He reports current alcohol use. He reports that he does not use drugs. Reports 18 beers/day when off.  No Known Allergies  FamHx History reviewed. No pertinent family history.  Prior to Admission medications   Medication Sig Start Date End Date Taking? Authorizing Provider  ibuprofen (ADVIL) 200 MG tablet Take 800 mg by mouth every 6 (six) hours as needed for fever.   Yes [provider]   pantoprazole (PROTONIX) 40 MG tablet Take 40 mg by mouth daily. 01/12/21  Yes [provider]    Physical Exam: Vitals:   01/13/21 1232 01/13/21 1430 01/13/21 1500 01/13/21 1600  BP: 138/81 129/76 126/84 130/80  Pulse: 99 90 88 82  Resp: 20 (!) 30 (!) 24 (!) 25  Temp:      SpO2: 99% 98% 98% 97%  Weight:      Height:        General: 45 y.o. male resting in bed in NAD Eyes: PERRL, icteric sclera ENMT: Nares patent w/o discharge, orophaynx clear, dentition normal, ears w/o discharge/lesions/ulcers Neck: Supple, trachea midline Cardiovascular: RRR, +S1, S2, no m/g/r, equal pulses throughout Respiratory: CTABL, no w/r/r, normal WOB GI: BS+, NDNT, obese, no masses noted, no organomegaly noted MSK: No e/c/c Skin: No rashes, bruises, ulcerations noted; he is jaundiced Neuro: A&O x 3, no focal deficits Psyc: Appropriate interaction and affect, calm/cooperative  Labs on Admission: I have personally reviewed following labs and imaging studies  CBC: Recent Labs  Lab 01/13/21 1040  WBC 7.2  NEUTROABS 6.5  HGB 16.7  HCT 48.0  MCV 89.4  PLT 121*   Basic Metabolic Panel: Recent Labs  Lab 01/13/21 1040  NA 137  K 3.7  CL 101  CO2 26  GLUCOSE 110*  BUN 18  CREATININE 1.33*  CALCIUM 9.3   GFR: Estimated Creatinine Clearance: 108.9 mL/min (A) (by C-G formula based on SCr of 1.33 mg/dL (  H)). Liver Function Tests: Recent Labs  Lab 01/13/21 1040  AST 155*  ALT 350*  ALKPHOS 62  BILITOT 7.5*  PROT 7.7  ALBUMIN 4.4   Recent Labs  Lab 01/13/21 1040  LIPASE 32   No results for input(s): AMMONIA in the last 168 hours. Coagulation Profile: Recent Labs  Lab 01/13/21 1040  INR 1.2   Cardiac Enzymes: No results for input(s): CKTOTAL, CKMB, CKMBINDEX, TROPONINI in the last 168 hours. BNP (last 3 results) No results for input(s): PROBNP in the last 8760 hours. HbA1C: No results for input(s): HGBA1C in the last 72 hours. CBG: No results for input(s): GLUCAP  in the last 168 hours. Lipid Profile: No results for input(s): CHOL, HDL, LDLCALC, TRIG, CHOLHDL, LDLDIRECT in the last 72 hours. Thyroid Function Tests: No results for input(s): TSH, T4TOTAL, FREET4, T3FREE, THYROIDAB in the last 72 hours. Anemia Panel: No results for input(s): VITAMINB12, FOLATE, FERRITIN, TIBC, IRON, RETICCTPCT in the last 72 hours. Urine analysis: No results found for: COLORURINE, APPEARANCEUR, LABSPEC, PHURINE, GLUCOSEU, HGBUR, BILIRUBINUR, KETONESUR, PROTEINUR, UROBILINOGEN, NITRITE, LEUKOCYTESUR  Radiological Exams on Admission: CT Abdomen Pelvis Wo Contrast  Result Date: 01/13/2021 CLINICAL DATA:  Abdominal pain.  Fever.  Elevated LFTs.  Jaundice. EXAM: CT ABDOMEN AND PELVIS WITHOUT CONTRAST TECHNIQUE: Multidetector CT imaging of the abdomen and pelvis was performed following the standard protocol without IV contrast. COMPARISON:  Abdominal sonogram 01/13/2021 FINDINGS: Lower chest: No acute abnormality. Hepatobiliary: There is diffuse hepatic steatosis. No focal liver abnormality identified. No gallbladder wall thickening or inflammation. No bile duct dilatation identified. Pancreas: Unremarkable. No pancreatic ductal dilatation or surrounding inflammatory changes. Spleen: The spleen measures 17.2 x 5.2 x 17.3 cm with a volume of 774 cc. No focal splenic lesion. Adrenals/Urinary Tract: Normal adrenal glands. No kidney stones, mass or hydronephrosis identified bilaterally. The urinary bladder is unremarkable. Stomach/Bowel: The stomach appears normal. The appendix is visualized and is within normal limits. There is no bowel wall thickening, inflammation or distension. Vascular/Lymphatic: Mild aortic atherosclerosis. No abdominopelvic adenopathy. Reproductive: Prostate is unremarkable. Other: No free fluid or fluid collections. Musculoskeletal: Degenerative disc disease identified at L5-S1. No acute or suspicious osseous findings. IMPRESSION: 1. No acute findings within the  abdomen or pelvis. 2. Hepatic steatosis. 3. Splenomegaly. 4. Aortic atherosclerosis. Aortic Atherosclerosis (ICD10-I70.0). Electronically Signed   By: Signa Kell M.D.   On: 01/13/2021 14:02   US Abdomen Limited RUQ (LIVER/GB)  Result Date: 01/13/2021 CLINICAL DATA:  Abdominal pain for 5 days. Elevated liver enzymes. Jaundice. EXAM: ULTRASOUND ABDOMEN LIMITED RIGHT UPPER QUADRANT COMPARISON:  None. FINDINGS: Gallbladder: Filling defects are seen in the dependent portion of the gallbladder, likely debris adhered to the wall the gallbladder. No wall thickening, pericholecystic fluid, stones, or Murphy's sign. Common bile duct: Diameter: 4.3 mm Liver: Marked increased heterogeneous echotexture. No focal mass. Portal vein is patent on color Doppler imaging with normal direction of blood flow towards the liver. Other: None. IMPRESSION: 1. The filling defects along the posterior wall of the gallbladder are favored to represent tumefactive sludge. The gallbladder is otherwise normal. 2. No biliary duct dilatation identified. 3. Marked increased echogenicity in the liver is nonspecific but often seen with hepatic steatosis. Electronically Signed   By: Gerome Sam III M.D   On: 01/13/2021 11:59   Assessment/Plan Acute hepatitis     - place in obs, med-surg     - likely alcoholic hepatitis     - INR is 1.2; AST/ALT are 155/350; T bili is 7.5     -  imaging shows diffuse steatosis     - Maddrey discriminant function is 19; PT is 15.1     - gentle fluids, supportive care, B12/B1/Folate     - trend LFTs, check fractionated bili, check hepatitis panel     - [facility] to see     - also check UA, bld Cx as he has reported fevers, but none seen in ED  EtOH abuse     - endorses drinking 18 beers/day when off from work (works 24hrs on, 48hrs off)     - Denies any history of DTs     - will start CIWA protocol     - counseled against further EtOH use  AKI     - imaging w/o evidence of obstruction      -  gentle fluids, watch nephrotoxins  Hepatic steatosis Morbid obesity     - counseled on diet, lifestyle changes  Thrombocytopenia     - as a fxn of liver disease     - no evidence of bleed, follow  DVT prophylaxis: lovenox  Code Status: FULL  Family Communication: None at bedside  Consults called: [facility]   Status is: Observation  The patient remains OBS appropriate and will d/c before 2 midnights.  Dispo: The patient is from: Home              Anticipated d/c is to: Home              Patient currently is not medically stable to d/c.   Difficult to place patient No  Teddy Spike DO [facility]  If 7PM-7AM, please contact night-coverage [email or web address]  01/13/2021, 4:54 PM

## 2021-01-13 NOTE — ED Provider Notes (Signed)
[facility] EMERGENCY DEPT Provider Note   CSN: [id number] Arrival date & time: 01/13/21  1004     History Chief Complaint  Patient presents with  . Abdominal Pain    Francis Smith is a 45 y.o. male.  He developed upper abdominal pain suddenly as severely about 4 days ago.  Pain occurred while eating.  It was relieved several hours later with Gas-X.  He had another exacerbation, and was seen by his primary care doctor yesterday.  He was directed to the ED today when labs that were drawn revealed elevated bilirubin and liver enzymes.  The history is provided by the patient.  Abdominal Pain Pain location:  Generalized Pain quality: sharp   Pain radiates to:  Does not radiate Pain severity:  Severe Onset quality:  Sudden Duration:  4 days Timing:  Intermittent Progression:  Resolved Chronicity:  New Context: alcohol use (18 pack several times per week) and eating   Context: not recent illness, not sick contacts, not suspicious food intake and not trauma   Relieved by: Gas-X. Worsened by:  Eating Associated symptoms: chills and fever (101)   Associated symptoms: no chest pain, no constipation, no cough, no diarrhea, no dysuria, no hematochezia, no hematuria, no melena, no shortness of breath, no sore throat and no vomiting        Past Medical History:  Diagnosis Date  . Shoulder dislocation, recurrent     There are no problems to display for this patient.   Past Surgical History:  Procedure Laterality Date  . SHOULDER ARTHROSCOPY W/ ROTATOR CUFF REPAIR Right 2012  . WISDOM TOOTH EXTRACTION         History reviewed. No pertinent family history.  Social History   Tobacco Use  . Smoking status: Never Smoker  . Smokeless tobacco: Current User  Vaping Use  . Vaping Use: Never used  Substance Use Topics  . Alcohol use: Yes  . Drug use: Never    Home Medications Prior to Admission medications   Not on File    Allergies    Patient has no  known allergies.  Review of Systems   Review of Systems  Constitutional: Positive for chills and fever (101).  HENT: Negative for ear pain and sore throat.   Eyes: Negative for pain and visual disturbance.  Respiratory: Negative for cough and shortness of breath.   Cardiovascular: Negative for chest pain and palpitations.  Gastrointestinal: Positive for abdominal pain. Negative for constipation, diarrhea, hematochezia, melena and vomiting.  Genitourinary: Negative for dysuria and hematuria.  Musculoskeletal: Negative for arthralgias and back pain.  Skin: Negative for color change and rash.  Neurological: Negative for seizures and syncope.  All other systems reviewed and are negative.   Physical Exam Updated Vital Signs BP 138/81   Pulse 99   Temp 98.4 F (36.9 C)   Resp 20   Ht 6\' 2"  (1.88 m)   Wt (!) 148.3 kg   SpO2 99%   BMI 41.98 kg/m   Physical Exam Vitals and nursing note reviewed.  Constitutional:      Appearance: He is well-developed.  HENT:     Head: Normocephalic and atraumatic.  Eyes:     General: Scleral icterus present.     Conjunctiva/sclera: Conjunctivae normal.  Cardiovascular:     Rate and Rhythm: Normal rate and regular rhythm.     Heart sounds: No murmur heard.   Pulmonary:     Effort: Pulmonary effort is normal. No respiratory distress.  Breath sounds: Normal breath sounds.  Abdominal:     General: Bowel sounds are normal.     Palpations: Abdomen is soft.     Tenderness: There is generalized abdominal tenderness. There is no guarding or rebound.  Musculoskeletal:     Cervical back: Neck supple.  Skin:    General: Skin is warm and dry.  Neurological:     Mental Status: He is alert.     ED Results / Procedures / Treatments   Labs (all labs ordered are listed, but only abnormal results are displayed) Labs Reviewed  COMPREHENSIVE METABOLIC PANEL - Abnormal; Notable for the following components:      Result Value   Glucose, Bld 110  (*)    Creatinine, Ser 1.33 (*)    AST 155 (*)    ALT 350 (*)    Total Bilirubin 7.5 (*)    All other components within normal limits  CBC WITH DIFFERENTIAL/PLATELET - Abnormal; Notable for the following components:   Platelets 121 (*)    Lymphs Abs 0.3 (*)    All other components within normal limits  RESP PANEL BY RT-PCR (FLU A&B, COVID) ARPGX2  LIPASE, BLOOD  PROTIME-INR  HEPATITIS PANEL, ACUTE    EKG None  Radiology US Abdomen Limited RUQ (LIVER/GB)  Result Date: 01/13/2021 CLINICAL DATA:  Abdominal pain for 5 days. Elevated liver enzymes. Jaundice. EXAM: ULTRASOUND ABDOMEN LIMITED RIGHT UPPER QUADRANT COMPARISON:  None. FINDINGS: Gallbladder: Filling defects are seen in the dependent portion of the gallbladder, likely debris adhered to the wall the gallbladder. No wall thickening, pericholecystic fluid, stones, or Murphy's sign. Common bile duct: Diameter: 4.3 mm Liver: Marked increased heterogeneous echotexture. No focal mass. Portal vein is patent on color Doppler imaging with normal direction of blood flow towards the liver. Other: None. IMPRESSION: 1. The filling defects along the posterior wall of the gallbladder are favored to represent tumefactive sludge. The gallbladder is otherwise normal. 2. No biliary duct dilatation identified. 3. Marked increased echogenicity in the liver is nonspecific but often seen with hepatic steatosis. Electronically Signed   By: Gerome Sam III M.D   On: 01/13/2021 11:59    Procedures Procedures   Medications Ordered in ED Medications  fentaNYL (SUBLIMAZE) injection 50 mcg (50 mcg Intravenous Given 01/13/21 1227)  iohexol (OMNIPAQUE) 9 MG/ML oral solution 500 mL (500 mLs Oral Contrast Given 01/13/21 1228)    ED Course  I have reviewed the triage vital signs and the nursing notes.  Pertinent labs & imaging results that were available during my care of the patient were reviewed by me and considered in my medical decision making (see chart  for details).  Clinical Course as of 01/13/21 1256  Thu Jan 13, 2021  1222 US Abdomen Limited RUQ (LIVER/GB) [AW]  1225 GI recommends going ahead with CT. Patient should be admitted for f/u.  [AW]  1255 I spoke with Dr. Ronaldo Miyamoto. [AW]    Clinical Course User Index [AW] Koleen Distance, MD   MDM Rules/Calculators/A&P                          Mellody Life presented with abdominal pain, subjective fevers, and elevated liver enzymes.  He was evaluated for evidence of acute obstruction, hemolysis, infection.  Will be admitted for further work-up.  At this point, the gallbladder ultrasound did not reveal evidence of biliary obstruction.  CT scan is pending, and GI will also weigh in on this case.  Final Clinical Impression(s) / ED Diagnoses Final diagnoses:  Jaundice    Rx / DC Orders ED Discharge Orders    None       Koleen Distance, MD 01/13/21 1259

## 2021-01-13 NOTE — ED Notes (Signed)
Report given to Santo Held, RN at [facility]

## 2021-01-13 NOTE — Progress Notes (Signed)
Notified by EDP of need for admission d/t elevated LFTs, jaundice. [facility] accepts patient to med-surg bed at [facility]. EDP is to remain responsible for orders/medical decisions while patient is holding at [facility]. Upon arrival to [facility], [facility] will assume care. Nursing staff will call flow manager/carelink to notify them of patient's arrival so that the proper [facility] member may receive the patient. Nursing staff will notify the following consultants, [facility] (Dr. Bosie Clos), of patient's arrival for their evaluation. Thank you.

## 2021-01-13 NOTE — Progress Notes (Signed)
Paged careflow manager and Dr. Bosie Clos to inform of pts arrival to floor per order.

## 2021-01-13 NOTE — ED Triage Notes (Signed)
Pt c/o abd pain x 5 days.  Seen at PCP yesterday with blood work and found to have elevated liver enzymes and white count.  Pt is visibly jaundiced but denies any known medical hx.  C/O generalized abd pain/took laxative yesterday with BM x 3.

## 2021-01-13 NOTE — ED Notes (Signed)
Pt transported via [facility] to [facility] hospital room 1505.  Santo Held notified of pt departure.

## 2021-01-14 ENCOUNTER — Observation Stay (HOSPITAL_COMMUNITY): Payer: 59

## 2021-01-14 DIAGNOSIS — R1084 Generalized abdominal pain: Secondary | ICD-10-CM | POA: Diagnosis present

## 2021-01-14 DIAGNOSIS — Z20822 Contact with and (suspected) exposure to covid-19: Secondary | ICD-10-CM | POA: Diagnosis present

## 2021-01-14 DIAGNOSIS — F1010 Alcohol abuse, uncomplicated: Secondary | ICD-10-CM | POA: Diagnosis present

## 2021-01-14 DIAGNOSIS — K5900 Constipation, unspecified: Secondary | ICD-10-CM | POA: Diagnosis not present

## 2021-01-14 DIAGNOSIS — K7010 Alcoholic hepatitis without ascites: Secondary | ICD-10-CM | POA: Diagnosis present

## 2021-01-14 DIAGNOSIS — Z6841 Body Mass Index (BMI) 40.0 and over, adult: Secondary | ICD-10-CM | POA: Diagnosis not present

## 2021-01-14 DIAGNOSIS — R17 Unspecified jaundice: Secondary | ICD-10-CM | POA: Diagnosis not present

## 2021-01-14 DIAGNOSIS — K760 Fatty (change of) liver, not elsewhere classified: Secondary | ICD-10-CM | POA: Diagnosis present

## 2021-01-14 DIAGNOSIS — R161 Splenomegaly, not elsewhere classified: Secondary | ICD-10-CM | POA: Diagnosis present

## 2021-01-14 DIAGNOSIS — K219 Gastro-esophageal reflux disease without esophagitis: Secondary | ICD-10-CM | POA: Diagnosis present

## 2021-01-14 DIAGNOSIS — Z720 Tobacco use: Secondary | ICD-10-CM | POA: Diagnosis not present

## 2021-01-14 DIAGNOSIS — K8020 Calculus of gallbladder without cholecystitis without obstruction: Secondary | ICD-10-CM | POA: Diagnosis present

## 2021-01-14 DIAGNOSIS — N179 Acute kidney failure, unspecified: Secondary | ICD-10-CM | POA: Diagnosis present

## 2021-01-14 LAB — CBC
HCT: 39.7 % (ref 39.0–52.0)
Hemoglobin: 13.7 g/dL (ref 13.0–17.0)
MCH: 31.1 pg (ref 26.0–34.0)
MCHC: 34.5 g/dL (ref 30.0–36.0)
MCV: 90 fL (ref 80.0–100.0)
Platelets: 112 10*3/uL — ABNORMAL LOW (ref 150–400)
RBC: 4.41 MIL/uL (ref 4.22–5.81)
RDW: 12.5 % (ref 11.5–15.5)
WBC: 4 10*3/uL (ref 4.0–10.5)
nRBC: 0 % (ref 0.0–0.2)

## 2021-01-14 LAB — COMPREHENSIVE METABOLIC PANEL
ALT: 228 U/L — ABNORMAL HIGH (ref 0–44)
AST: 88 U/L — ABNORMAL HIGH (ref 15–41)
Albumin: 3.3 g/dL — ABNORMAL LOW (ref 3.5–5.0)
Alkaline Phosphatase: 48 U/L (ref 38–126)
Anion gap: 6 (ref 5–15)
BUN: 15 mg/dL (ref 6–20)
CO2: 23 mmol/L (ref 22–32)
Calcium: 8.4 mg/dL — ABNORMAL LOW (ref 8.9–10.3)
Chloride: 104 mmol/L (ref 98–111)
Creatinine, Ser: 1 mg/dL (ref 0.61–1.24)
GFR, Estimated: >60 mL/min (ref >60)
Glucose, Bld: 157 mg/dL — ABNORMAL HIGH (ref 70–99)
Potassium: 3.4 mmol/L — ABNORMAL LOW (ref 3.5–5.1)
Sodium: 133 mmol/L — ABNORMAL LOW (ref 135–145)
Total Bilirubin: 4.4 mg/dL — ABNORMAL HIGH (ref 0.3–1.2)
Total Protein: 6.5 g/dL (ref 6.5–8.1)

## 2021-01-14 LAB — HIV ANTIBODY (ROUTINE TESTING W REFLEX): HIV Screen 4th Generation wRfx: NONREACTIVE

## 2021-01-14 MED ORDER — GADOBUTROL 1 MMOL/ML IV SOLN
10.0000 mL | Freq: Once | INTRAVENOUS | Status: AC | PRN
  Administered 2021-01-14: 10 mL via INTRAVENOUS

## 2021-01-14 NOTE — Plan of Care (Signed)

## 2021-01-14 NOTE — Progress Notes (Signed)
PROGRESS NOTE    Francis Smith  ZOX:[id number] DOB: 26-Feb-1976 DOA: 01/13/2021 PCP: Patient, No Pcp Per (Inactive)   Brief Narrative: This 45 years old male with PMH significant for GERD presented in the ED with jaundice and found to have elevated liver enzymes.  Patient reports having epigastric abdominal pain for 4 days that happens mainly during eating,  He has tried Gas-X with partial improvement.  He has seen his primary care physician 1 day prior to hospitalization.  Lab work showed elevated liver enzymes and Patient has developed chills and fever , his PCP has advised him to come to the ED. Patient is admitted for acute alcoholic hepatitis.  GI is consulted,  CT abdomen and pelvis shows fatty liver.  GI recommended MRCP.   Assessment & Plan:   Active Problems:   Jaundice  Abdominal pain secondary to acute hepatitis; Patient presented with abdominal pain associated with low-grade fever. LFTs elevated AST/ALT: 155/350 with total bilirubin 7.5. Could be secondary to alcoholic hepatitis. Imaging shows diffuse steatosis. Continue supportive care, IV hydration Trend LFTs,  hepatitis panel negative. GI consulted recommended MRCP.   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 Denies any history of DTs. Continue CIWA protocol Counseled against further EtOH use.  AKI: Resolved with IV hydration.    Morbid obesity Counseled on diet and lifestyle changes.    DVT prophylaxis: Lovenox Code Status: Full code. Family Communication: (Family at bed side) Disposition Plan:   Status is: Inpatient  Remains inpatient appropriate because:Inpatient level of care appropriate due to severity of illness   Dispo: The patient is from: Home              Anticipated d/c is to: Home              Patient currently is not medically stable to d/c.   Difficult to place patient No  Consultants:   GI  Procedures: Abdomen and pelvis, MRCP,  ultrasound.  Antimicrobials:  Anti-infectives (From admission, onward)   None      Subjective: Patient was seen and examined at bedside.  Overnight events noted.   Patient reports feeling better,  he denies any nausea, vomiting or any withdrawal symptoms.  Objective: Vitals:   01/13/21 1707 01/13/21 2100 01/14/21 0107 01/14/21 0505  BP: (!) 141/92 123/78 129/80 136/84  Pulse: 85 93 75 79  Resp: 18 20 19  (!) 21  Temp: 99.8 F (37.7 C) 99.1 F (37.3 C) 98.6 F (37 C) 98.6 F (37 C)  TempSrc: Oral Oral Oral Oral  SpO2: 96% 95% 97% 94%  Weight:      Height:        Intake/Output Summary (Last 24 hours) at 01/14/2021 1442 Last data filed at 01/14/2021 1300 Gross per 24 hour  Intake 746.98 ml  Output --  Net 746.98 ml   Filed Weights   01/13/21 1013  Weight: (!) 148.3 kg    Examination:  General exam: Appears calm and comfortable, not in any acute distress. Respiratory system: Clear to auscultation. Respiratory effort normal. Cardiovascular system: S1 & S2 heard, RRR. No JVD, murmurs, rubs, gallops or clicks. No pedal edema. Gastrointestinal system: Abdomen is nondistended, soft and nontender. No organomegaly or masses felt. Normal bowel sounds heard. Central nervous system: Alert and oriented. No focal neurological deficits. Extremities: Symmetric 5 x 5 power.  No edema, no cyanosis, no clubbing. Skin: No rashes, lesions or ulcers Psychiatry: Judgement and insight appear normal. Mood &  affect appropriate.     Data Reviewed: I have personally reviewed following labs and imaging studies  CBC: Recent Labs  Lab 01/13/21 1040 01/14/21 0551  WBC 7.2 4.0  NEUTROABS 6.5  --   HGB 16.7 13.7  HCT 48.0 39.7  MCV 89.4 90.0  PLT 121* 112*   Basic Metabolic Panel: Recent Labs  Lab 01/13/21 1040 01/13/21 1845 01/14/21 0551  NA 137  --  133*  K 3.7  --  3.4*  CL 101  --  104  CO2 26  --  23  GLUCOSE 110*  --  157*  BUN 18  --  15  CREATININE 1.33*  --  1.00   CALCIUM 9.3  --  8.4*  MG  --  2.2  --   PHOS  --  2.5  --    GFR: Estimated Creatinine Clearance: 144.8 mL/min (by C-G formula based on SCr of 1 mg/dL). Liver Function Tests: Recent Labs  Lab 01/13/21 1040 01/13/21 1845 01/14/21 0551  AST 155*  --  88*  ALT 350*  --  228*  ALKPHOS 62  --  48  BILITOT 7.5* 5.2* 4.4*  PROT 7.7  --  6.5  ALBUMIN 4.4  --  3.3*   Recent Labs  Lab 01/13/21 1040  LIPASE 32   No results for input(s): AMMONIA in the last 168 hours. Coagulation Profile: Recent Labs  Lab 01/13/21 1040  INR 1.2   Cardiac Enzymes: No results for input(s): CKTOTAL, CKMB, CKMBINDEX, TROPONINI in the last 168 hours. BNP (last 3 results) No results for input(s): PROBNP in the last 8760 hours. HbA1C: No results for input(s): HGBA1C in the last 72 hours. CBG: No results for input(s): GLUCAP in the last 168 hours. Lipid Profile: No results for input(s): CHOL, HDL, LDLCALC, TRIG, CHOLHDL, LDLDIRECT in the last 72 hours. Thyroid Function Tests: No results for input(s): TSH, T4TOTAL, FREET4, T3FREE, THYROIDAB in the last 72 hours. Anemia Panel: No results for input(s): VITAMINB12, FOLATE, FERRITIN, TIBC, IRON, RETICCTPCT in the last 72 hours. Sepsis Labs: No results for input(s): PROCALCITON, LATICACIDVEN in the last 168 hours.  Recent Results (from the past 240 hour(s))  Resp Panel by RT-PCR (Flu A&B, Covid) Nasopharyngeal Swab     Status: None   Collection Time: 01/13/21 10:55 AM   Specimen: Nasopharyngeal Swab; Nasopharyngeal(NP) swabs in vial transport medium  Result Value Ref Range Status   SARS Coronavirus 2 by RT PCR NEGATIVE NEGATIVE Final    Comment: (NOTE) SARS-CoV-2 target nucleic acids are NOT DETECTED.  The SARS-CoV-2 RNA is generally detectable in upper respiratory specimens during the acute phase of infection. The lowest concentration of SARS-CoV-2 viral copies this assay can detect is 138 copies/mL. A negative result does not preclude  SARS-Cov-2 infection and should not be used as the sole basis for treatment or other patient management decisions. A negative result may occur with  improper specimen collection/handling, submission of specimen other than nasopharyngeal swab, presence of viral mutation(s) within the areas targeted by this assay, and inadequate number of viral copies(<138 copies/mL). A negative result must be combined with clinical observations, patient history, and epidemiological information. The expected result is Negative.  Fact Sheet for Patients:  BloggerCourse.com  Fact Sheet for Healthcare Providers:  SeriousBroker.it  This test is no t yet approved or cleared by the [place] FDA and  has been authorized for detection and/or diagnosis of SARS-CoV-2 by FDA under an Emergency Use Authorization (EUA). This EUA will remain  in  effect (meaning this test can be used) for the duration of the COVID-19 declaration under Section 564(b)(1) of the Act, 21 U.S.C.section 360bbb-3(b)(1), unless the authorization is terminated  or revoked sooner.       Influenza A by PCR NEGATIVE NEGATIVE Final   Influenza B by PCR NEGATIVE NEGATIVE Final    Comment: (NOTE) The Xpert Xpress SARS-CoV-2/FLU/RSV plus assay is intended as an aid in the diagnosis of influenza from Nasopharyngeal swab specimens and should not be used as a sole basis for treatment. Nasal washings and aspirates are unacceptable for Xpert Xpress SARS-CoV-2/FLU/RSV testing.  Fact Sheet for Patients: BloggerCourse.com  Fact Sheet for Healthcare Providers: SeriousBroker.it  This test is not yet approved or cleared by the [place] FDA and has been authorized for detection and/or diagnosis of SARS-CoV-2 by FDA under an Emergency Use Authorization (EUA). This EUA will remain in effect (meaning this test can be used) for the duration of  the COVID-19 declaration under Section 564(b)(1) of the Act, 21 U.S.C. section 360bbb-3(b)(1), unless the authorization is terminated or revoked.  Performed at [facility], [address] [place], [place] [address]   Culture, blood (routine x 2)     Status: None (Preliminary result)   Collection Time: 01/13/21  6:46 PM   Specimen: BLOOD RIGHT HAND  Result Value Ref Range Status   Specimen Description   Final    BLOOD RIGHT HAND Performed at [facility], [address], [place], [place] [address]    Special Requests   Final    BOTTLES DRAWN AEROBIC ONLY Blood Culture adequate volume Performed at [facility], [address] [place], [place] [address]    Culture PENDING  Incomplete   Report Status PENDING  Incomplete    Radiology Studies: CT Abdomen Pelvis Wo Contrast  Result Date: 01/13/2021 CLINICAL DATA:  Abdominal pain.  Fever.  Elevated LFTs.  Jaundice. EXAM: CT ABDOMEN AND PELVIS WITHOUT CONTRAST TECHNIQUE: Multidetector CT imaging of the abdomen and pelvis was performed following the standard protocol without IV contrast. COMPARISON:  Abdominal sonogram 01/13/2021 FINDINGS: Lower chest: No acute abnormality. Hepatobiliary: There is diffuse hepatic steatosis. No focal liver abnormality identified. No gallbladder wall thickening or inflammation. No bile duct dilatation identified. Pancreas: Unremarkable. No pancreatic ductal dilatation or surrounding inflammatory changes. Spleen: The spleen measures 17.2 x 5.2 x 17.3 cm with a volume of 774 cc. No focal splenic lesion. Adrenals/Urinary Tract: Normal adrenal glands. No kidney stones, mass or hydronephrosis identified bilaterally. The urinary bladder is unremarkable. Stomach/Bowel: The stomach appears normal. The appendix is visualized and is within normal limits. There is no bowel wall thickening, inflammation or distension. Vascular/Lymphatic: Mild aortic atherosclerosis. No abdominopelvic  adenopathy. Reproductive: Prostate is unremarkable. Other: No free fluid or fluid collections. Musculoskeletal: Degenerative disc disease identified at L5-S1. No acute or suspicious osseous findings. IMPRESSION: 1. No acute findings within the abdomen or pelvis. 2. Hepatic steatosis. 3. Splenomegaly. 4. Aortic atherosclerosis. Aortic Atherosclerosis (ICD10-I70.0). Electronically Signed   By: Signa Kell M.D.   On: 01/13/2021 14:02   US Abdomen Limited RUQ (LIVER/GB)  Result Date: 01/13/2021 CLINICAL DATA:  Abdominal pain for 5 days. Elevated liver enzymes. Jaundice. EXAM: ULTRASOUND ABDOMEN LIMITED RIGHT UPPER QUADRANT COMPARISON:  None. FINDINGS: Gallbladder: Filling defects are seen in the dependent portion of the gallbladder, likely debris adhered to the wall the gallbladder. No wall thickening, pericholecystic fluid, stones, or Murphy's sign. Common bile duct: Diameter: 4.3 mm Liver: Marked increased heterogeneous echotexture. No  focal mass. Portal vein is patent on color Doppler imaging with normal direction of blood flow towards the liver. Other: None. IMPRESSION: 1. The filling defects along the posterior wall of the gallbladder are favored to represent tumefactive sludge. The gallbladder is otherwise normal. 2. No biliary duct dilatation identified. 3. Marked increased echogenicity in the liver is nonspecific but often seen with hepatic steatosis. Electronically Signed   By: Gerome Sam III M.D   On: 01/13/2021 11:59    Scheduled Meds: . docusate sodium  100 mg Oral BID  . folic acid  1 mg Oral Daily  . multivitamin with minerals  1 tablet Oral Daily  . pantoprazole  40 mg Oral Daily  . thiamine  100 mg Oral Daily   Or  . thiamine  100 mg Intravenous Daily   Continuous Infusions: . sodium chloride 75 mL/hr at 01/14/21 0416     LOS: 0 days    Time spent: 35 mins    Greidy Sherard, MD [facility]   If 7PM-7AM, please contact night-coverage

## 2021-01-14 NOTE — Consult Note (Signed)
Referring Provider: [facility] Primary Care Physician:  Patient, No Pcp Per (Inactive) Primary Gastroenterologist:  Gentry Fitz  Reason for Consultation:  Abnormal LFTs  HPI: Francis Smith is a 45 y.o. male with history of GERD presenting for consultation of abnormal LFTs.  Patient presented to the [facility] yesterday with epigastric abdominal pain, fever (Tmax 99-100 per patient report), and chills.  He states he started experiencing epigastric abdominal pain on Sunday 5/8, after eating cube steak and gravy, which he initially thought was gas pain.  He denies prior episodes of abdominal pain.  He reports associated nausea but denies vomiting.  He also reports some recent constipation.  He takes Protonix for GERD. Denies dysphagia, changes in appetite, or unexplained weight loss (reports intentional weight loss of 13 lbs with diet).    Reports mother, father, and 3 sisters have had cholecystectomies.  Denies other family history of liver or gastrointestinal disorders or malignancy.  Denies ASA or blood thinner use.  Occasionally takes ibuprofen for a headache, but not on a regular basis.  He drinks alcohol approximately 4 days per week (when off work), and drinks from 1-18 beers "depending on the day."  He last drank Saturday, and had 3 beers.    Past Medical History:  Diagnosis Date  . Shoulder dislocation, recurrent     Past Surgical History:  Procedure Laterality Date  . SHOULDER ARTHROSCOPY W/ ROTATOR CUFF REPAIR Right 2012  . WISDOM TOOTH EXTRACTION      Prior to Admission medications   Medication Sig Start Date End Date Taking? Authorizing Provider  ibuprofen (ADVIL) 200 MG tablet Take 800 mg by mouth every 6 (six) hours as needed for fever.   Yes [provider]  pantoprazole (PROTONIX) 40 MG tablet Take 40 mg by mouth daily. 01/12/21  Yes [provider]    Scheduled Meds: . docusate sodium  100 mg Oral BID  . folic acid  1 mg Oral Daily  .  multivitamin with minerals  1 tablet Oral Daily  . pantoprazole  40 mg Oral Daily  . thiamine  100 mg Oral Daily   Or  . thiamine  100 mg Intravenous Daily   Continuous Infusions: . sodium chloride 75 mL/hr at 01/14/21 0416   PRN Meds:.acetaminophen **OR** acetaminophen, LORazepam **OR** LORazepam, magnesium hydroxide  Allergies as of 01/13/2021  . (No Known Allergies)    History reviewed. No pertinent family history.  Social History   Socioeconomic History  . Marital status: Legally Separated    Spouse name: Not on file  . Number of children: Not on file  . Years of education: Not on file  . Highest education level: Not on file  Occupational History  . Not on file  Tobacco Use  . Smoking status: Never Smoker  . Smokeless tobacco: Current User  Vaping Use  . Vaping Use: Never used  Substance and Sexual Activity  . Alcohol use: Yes  . Drug use: Never  . Sexual activity: Not on file  Other Topics Concern  . Not on file  Social History Narrative  . Not on file   Social Determinants of Health   Financial Resource Strain: Not on file  Food Insecurity: Not on file  Transportation Needs: Not on file  Physical Activity: Not on file  Stress: Not on file  Social Connections: Not on file  Intimate Partner Violence: Not on file    Review of Systems: Review of Systems  Constitutional: Positive for chills and fever.  HENT: Negative for  hearing loss and tinnitus.   Eyes: Negative for pain and redness.  Respiratory: Negative for cough and shortness of breath.   Cardiovascular: Negative for chest pain and palpitations.  Gastrointestinal: Positive for abdominal pain, constipation, heartburn and nausea. Negative for blood in stool, diarrhea, melena and vomiting.  Genitourinary: Negative for flank pain and hematuria.  Musculoskeletal: Negative for falls and joint pain.  Skin: Negative for itching and rash.  Neurological: Negative for seizures and loss of consciousness.   Endo/Heme/Allergies: Negative for polydipsia. Does not bruise/bleed easily.  Psychiatric/Behavioral: Positive for substance abuse (ETOH). The patient is not nervous/anxious.     Physical Exam: Vital signs: Vitals:   01/14/21 0107 01/14/21 0505  BP: 129/80 136/84  Pulse: 75 79  Resp: 19 (!) 21  Temp: 98.6 F (37 C) 98.6 F (37 C)  SpO2: 97% 94%   Last BM Date: 01/12/21 Physical Exam Vitals reviewed.  Constitutional:      General: He is not in acute distress.    Appearance: He is obese.  HENT:     Head: Normocephalic and atraumatic.     Nose: Nose normal. No congestion.     Mouth/Throat:     Mouth: Mucous membranes are moist.     Pharynx: Oropharynx is clear.  Eyes:     General: Scleral icterus present.     Extraocular Movements: Extraocular movements intact.  Cardiovascular:     Rate and Rhythm: Normal rate and regular rhythm.     Heart sounds: Normal heart sounds.  Pulmonary:     Effort: Pulmonary effort is normal. No respiratory distress.  Abdominal:     General: Bowel sounds are normal. There is no distension.     Palpations: Abdomen is soft. There is no mass.     Tenderness: There is no abdominal tenderness. There is no guarding or rebound.     Hernia: No hernia is present.  Musculoskeletal:        General: No swelling or tenderness.     Cervical back: Normal range of motion and neck supple.  Skin:    General: Skin is warm and dry.     Coloration: Skin is jaundiced.  Neurological:     General: No focal deficit present.     Mental Status: He is alert and oriented to person, place, and time.  Psychiatric:        Mood and Affect: Mood normal.        Behavior: Behavior normal. Behavior is cooperative.     GI:  Lab Results: Recent Labs    01/13/21 1040 01/14/21 0551  WBC 7.2 4.0  HGB 16.7 13.7  HCT 48.0 39.7  PLT 121* 112*   BMET Recent Labs    01/13/21 1040 01/14/21 0551  NA 137 133*  K 3.7 3.4*  CL 101 104  CO2 26 23  GLUCOSE 110* 157*   BUN 18 15  CREATININE 1.33* 1.00  CALCIUM 9.3 8.4*   LFT Recent Labs    01/13/21 1845 01/14/21 0551  PROT  --  6.5  ALBUMIN  --  3.3*  AST  --  88*  ALT  --  228*  ALKPHOS  --  48  BILITOT 5.2* 4.4*  BILIDIR 3.3*  --   IBILI 1.9*  --    PT/INR Recent Labs    01/13/21 1040  LABPROT 15.1  INR 1.2     Studies/Results: CT Abdomen Pelvis Wo Contrast  Result Date: 01/13/2021 CLINICAL DATA:  Abdominal pain.  Fever.  Elevated  LFTs.  Jaundice. EXAM: CT ABDOMEN AND PELVIS WITHOUT CONTRAST TECHNIQUE: Multidetector CT imaging of the abdomen and pelvis was performed following the standard protocol without IV contrast. COMPARISON:  Abdominal sonogram 01/13/2021 FINDINGS: Lower chest: No acute abnormality. Hepatobiliary: There is diffuse hepatic steatosis. No focal liver abnormality identified. No gallbladder wall thickening or inflammation. No bile duct dilatation identified. Pancreas: Unremarkable. No pancreatic ductal dilatation or surrounding inflammatory changes. Spleen: The spleen measures 17.2 x 5.2 x 17.3 cm with a volume of 774 cc. No focal splenic lesion. Adrenals/Urinary Tract: Normal adrenal glands. No kidney stones, mass or hydronephrosis identified bilaterally. The urinary bladder is unremarkable. Stomach/Bowel: The stomach appears normal. The appendix is visualized and is within normal limits. There is no bowel wall thickening, inflammation or distension. Vascular/Lymphatic: Mild aortic atherosclerosis. No abdominopelvic adenopathy. Reproductive: Prostate is unremarkable. Other: No free fluid or fluid collections. Musculoskeletal: Degenerative disc disease identified at L5-S1. No acute or suspicious osseous findings. IMPRESSION: 1. No acute findings within the abdomen or pelvis. 2. Hepatic steatosis. 3. Splenomegaly. 4. Aortic atherosclerosis. Aortic Atherosclerosis (ICD10-I70.0). Electronically Signed   By: Signa Kell M.D.   On: 01/13/2021 14:02   US Abdomen Limited RUQ  (LIVER/GB)  Result Date: 01/13/2021 CLINICAL DATA:  Abdominal pain for 5 days. Elevated liver enzymes. Jaundice. EXAM: ULTRASOUND ABDOMEN LIMITED RIGHT UPPER QUADRANT COMPARISON:  None. FINDINGS: Gallbladder: Filling defects are seen in the dependent portion of the gallbladder, likely debris adhered to the wall the gallbladder. No wall thickening, pericholecystic fluid, stones, or Murphy's sign. Common bile duct: Diameter: 4.3 mm Liver: Marked increased heterogeneous echotexture. No focal mass. Portal vein is patent on color Doppler imaging with normal direction of blood flow towards the liver. Other: None. IMPRESSION: 1. The filling defects along the posterior wall of the gallbladder are favored to represent tumefactive sludge. The gallbladder is otherwise normal. 2. No biliary duct dilatation identified. 3. Marked increased echogenicity in the liver is nonspecific but often seen with hepatic steatosis. Electronically Signed   By: Gerome Sam III M.D   On: 01/13/2021 11:59    Impression: Abnormal LFTs: Biliary obstruction vs alcoholic hepatitis (though ALT>AST not consistent with alcoholic hepatitis). Hepatic discriminant function <32 as of 01/13/21. -T. Bili 4.4/ AST 88/ ALT 228/ ALP 48, improved from T. Bili 7.5/ AST 155/ ALT 350/ ALP 62 -Normal PT/INR as of 01/13/21 -No leukocytosis (CB -CT showed hepatic steatosis, no bile duct dilation.  US showed filling defects along the posterior wall of the gallbladder, favored to represent tumefactive sludge.  No biliary duct dilatation identified. Hepatic steatosis.  Plan: MRI/MRCP for further evaluation.  NPO until post-MRCP.  Continue to trend LFTs.  Continue supportive care.  [facility] will follow.   LOS: 0 days   Edrick Kins  PA-C 01/14/2021, 9:33 AM  Contact #  [phone]

## 2021-01-15 LAB — CBC
HCT: 41.4 % (ref 39.0–52.0)
Hemoglobin: 14.2 g/dL (ref 13.0–17.0)
MCH: 31.1 pg (ref 26.0–34.0)
MCHC: 34.3 g/dL (ref 30.0–36.0)
MCV: 90.6 fL (ref 80.0–100.0)
Platelets: 123 10*3/uL — ABNORMAL LOW (ref 150–400)
RBC: 4.57 MIL/uL (ref 4.22–5.81)
RDW: 12.5 % (ref 11.5–15.5)
WBC: 3.6 10*3/uL — ABNORMAL LOW (ref 4.0–10.5)
nRBC: 0 % (ref 0.0–0.2)

## 2021-01-15 LAB — COMPREHENSIVE METABOLIC PANEL
ALT: 202 U/L — ABNORMAL HIGH (ref 0–44)
AST: 81 U/L — ABNORMAL HIGH (ref 15–41)
Albumin: 3.3 g/dL — ABNORMAL LOW (ref 3.5–5.0)
Alkaline Phosphatase: 48 U/L (ref 38–126)
Anion gap: 6 (ref 5–15)
BUN: 14 mg/dL (ref 6–20)
CO2: 26 mmol/L (ref 22–32)
Calcium: 8.7 mg/dL — ABNORMAL LOW (ref 8.9–10.3)
Chloride: 106 mmol/L (ref 98–111)
Creatinine, Ser: 0.96 mg/dL (ref 0.61–1.24)
GFR, Estimated: >60 mL/min (ref >60)
Glucose, Bld: 144 mg/dL — ABNORMAL HIGH (ref 70–99)
Potassium: 3.7 mmol/L (ref 3.5–5.1)
Sodium: 138 mmol/L (ref 135–145)
Total Bilirubin: 2.8 mg/dL — ABNORMAL HIGH (ref 0.3–1.2)
Total Protein: 6.8 g/dL (ref 6.5–8.1)

## 2021-01-15 LAB — PHOSPHORUS: Phosphorus: 2.8 mg/dL (ref 2.5–4.6)

## 2021-01-15 LAB — MAGNESIUM: Magnesium: 2.1 mg/dL (ref 1.7–2.4)

## 2021-01-15 LAB — URINE CULTURE: Culture: <10000 — AB

## 2021-01-15 MED ORDER — THIAMINE HCL 100 MG PO TABS: 100.0000 mg | ORAL_TABLET | Freq: Every day | ORAL | 0 refills | Status: AC

## 2021-01-15 MED ORDER — FOLIC ACID 1 MG PO TABS: 1.0000 mg | ORAL_TABLET | Freq: Every day | ORAL | 0 refills | Status: AC

## 2021-01-15 NOTE — Discharge Summary (Signed)
Physician Discharge Summary  Francis Smith UJW:[id number] DOB: [birth year] DOA: 01/13/2021  PCP: Patient, No Pcp Per (Inactive)  Admit date: 01/13/2021   Discharge date: 01/15/2021  Admitted From: Home.  Disposition: Home.  Recommendations for Outpatient Follow-up:  Follow up with PCP in 1-2 weeks. Please obtain CMP/CBC in one week 3.   Advised to follow up GI Dr. Bosie Clos in 2 weeks. 4.   Advised to avoid alcohol for 3-4 weeks.  Home Health: None Equipment/Devices: None  Discharge Condition: Stable CODE STATUS:Full code Diet recommendation: Heart Healthy   Brief Summary / Hospital Course: This 45 years old male with PMH significant for GERD presented in the ED with abdominal pain, jaundice and found to have elevated liver enzymes.  Patient reports having epigastric abdominal pain for 4 days that happens mainly during eating,  He has tried Gas-X with partial improvement.  He has seen his primary care physician one day prior to hospitalization.  Lab work showed elevated liver enzymes and  Bilirubin level. Patient has developed chills and fever same day , his PCP has advised him to come to the ED. Patient reports drinking alcohol up to 18 beers at one time when he is not working.  He works 24 hours and off for 48 hours.  He denies any withdrawal symptoms. Patient was admitted for acute alcoholic hepatitis.  He was started on supportive care, IV fluids and pain control.  Liver enzymes trended down,  total bilirubin has trended down to 2.0.  Hepatitis panel negative.  GI was consulted,  CT abdomen and pelvis showed fatty liver.  GI recommended MRI Abdomen. MRCP showed cholelithiasis without acute cholecystitis or choledocholithiasis.  Patient felt better and want to be discharged.  Patient was explained in detail to avoid alcohol for 3 to 4 weeks.  Resources and references were provided to quit alcohol.  He was managed for below problems.   Discharge Diagnoses:  Active Problems:    Jaundice  Abdominal pain secondary to acute hepatitis; Patient presented with abdominal pain associated with low-grade fever. LFTs elevated AST/ALT: 155/350 with total bilirubin 7.5. Could be secondary to alcoholic hepatitis. Imaging shows diffuse steatosis. Continue supportive care, IV hydration Trend LFTs,  hepatitis panel negative. GI consulted recommended MRCP. MRCP shows cholelithiasis without acute cholecystitis or choledocholithiasis. GI signed off liver enzymes trended down bilirubin trended down to 2.0 Patient is being discharged home.     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 Denies any history of DTs. Continue CIWA protocol Counseled against further EtOH use.   AKI: Resolved with IV hydration.     Morbid obesity Counseled on diet and lifestyle changes.    Discharge Instructions  Discharge Instructions     Call MD for:  difficulty breathing, headache or visual disturbances   Complete by: As directed    Call MD for:  persistant dizziness or light-headedness   Complete by: As directed    Call MD for:  persistant nausea and vomiting   Complete by: As directed    Diet - low sodium heart healthy   Complete by: As directed    Diet Carb Modified   Complete by: As directed    Discharge instructions   Complete by: As directed    Advised to follow up PCP in one week. Advised to follow up GI Dr. Bosie Clos in 2 weeks. Advised to avoid alcohol for 3-4 weeks.   Increase activity slowly   Complete by: As directed  Allergies as of 01/15/2021   No Known Allergies      Medication List     TAKE these medications    folic acid 1 MG tablet Commonly known as: FOLVITE Take 1 tablet (1 mg total) by mouth daily. Start taking on: Jan 16, 2021   ibuprofen 200 MG tablet Commonly known as: ADVIL Take 800 mg by mouth every 6 (six) hours as needed for fever.   pantoprazole 40 MG tablet Commonly known as: PROTONIX Take 40  mg by mouth daily.   thiamine 100 MG tablet Take 1 tablet (100 mg total) by mouth daily. Start taking on: Jan 16, 2021        Follow-up Information     Charlott Rakes, MD Follow up in 1 week(s).   Specialty: Gastroenterology Contact information: [address] [place] [address] [phone]                No Known Allergies  Consultations: GI   Procedures/Studies: CT Abdomen Pelvis Wo Contrast  Result Date: 01/13/2021 CLINICAL DATA:  Abdominal pain.  Fever.  Elevated LFTs.  Jaundice. EXAM: CT ABDOMEN AND PELVIS WITHOUT CONTRAST TECHNIQUE: Multidetector CT imaging of the abdomen and pelvis was performed following the standard protocol without IV contrast. COMPARISON:  Abdominal sonogram 01/13/2021 FINDINGS: Lower chest: No acute abnormality. Hepatobiliary: There is diffuse hepatic steatosis. No focal liver abnormality identified. No gallbladder wall thickening or inflammation. No bile duct dilatation identified. Pancreas: Unremarkable. No pancreatic ductal dilatation or surrounding inflammatory changes. Spleen: The spleen measures 17.2 x 5.2 x 17.3 cm with a volume of 774 cc. No focal splenic lesion. Adrenals/Urinary Tract: Normal adrenal glands. No kidney stones, mass or hydronephrosis identified bilaterally. The urinary bladder is unremarkable. Stomach/Bowel: The stomach appears normal. The appendix is visualized and is within normal limits. There is no bowel wall thickening, inflammation or distension. Vascular/Lymphatic: Mild aortic atherosclerosis. No abdominopelvic adenopathy. Reproductive: Prostate is unremarkable. Other: No free fluid or fluid collections. Musculoskeletal: Degenerative disc disease identified at L5-S1. No acute or suspicious osseous findings. IMPRESSION: 1. No acute findings within the abdomen or pelvis. 2. Hepatic steatosis. 3. Splenomegaly. 4. Aortic atherosclerosis. Aortic Atherosclerosis (ICD10-I70.0). Electronically Signed   By:  Signa Kell M.D.   On: 01/13/2021 14:02   MR 3D Recon At Scanner  Result Date: 01/14/2021 CLINICAL DATA:  Jaundice, elevated LFTs EXAM: MRI ABDOMEN WITHOUT AND WITH CONTRAST (INCLUDING MRCP) TECHNIQUE: Multiplanar multisequence MR imaging of the abdomen was performed both before and after the administration of intravenous contrast. Heavily T2-weighted images of the biliary and pancreatic ducts were obtained, and three-dimensional MRCP images were rendered by post processing. CONTRAST:  40mL GADAVIST GADOBUTROL 1 MMOL/ML IV SOLN COMPARISON:  CT abdomen pelvis, 01/13/2021 FINDINGS: Lower chest: No acute findings. Hepatobiliary: Hepatic steatosis. No mass or other parenchymal abnormality identified. Small gallstones in the gallbladder. No gallbladder wall thickening or pericholecystic fluid. No biliary ductal dilatation. The common bile duct is patent to the ampulla without evidence of filling defect. Pancreas: No mass, inflammatory changes, or other parenchymal abnormality identified. Spleen:  Splenomegaly, maximum coronal span 19.2 cm. Adrenals/Urinary Tract: No masses identified. No evidence of hydronephrosis. Stomach/Bowel: Incidental small diverticulum of the descending duodenum. Visualized portions within the abdomen are otherwise unremarkable. Vascular/Lymphatic: No pathologically enlarged lymph nodes identified. No abdominal aortic aneurysm demonstrated. Other:  None. Musculoskeletal: No suspicious bone lesions identified. IMPRESSION: 1. Cholelithiasis without evidence of acute cholecystitis or choledocholithiasis. 2. The common bile duct is patent to the ampulla. No  biliary ductal dilatation. 3. Hepatic steatosis. 4. Splenomegaly. Electronically Signed   By: Lauralyn Primes M.D.   On: 01/14/2021 15:52   MR ABDOMEN MRCP W WO CONTAST  Result Date: 01/14/2021 CLINICAL DATA:  Jaundice, elevated LFTs EXAM: MRI ABDOMEN WITHOUT AND WITH CONTRAST (INCLUDING MRCP) TECHNIQUE: Multiplanar multisequence MR imaging  of the abdomen was performed both before and after the administration of intravenous contrast. Heavily T2-weighted images of the biliary and pancreatic ducts were obtained, and three-dimensional MRCP images were rendered by post processing. CONTRAST:  10mL GADAVIST GADOBUTROL 1 MMOL/ML IV SOLN COMPARISON:  CT abdomen pelvis, 01/13/2021 FINDINGS: Lower chest: No acute findings. Hepatobiliary: Hepatic steatosis. No mass or other parenchymal abnormality identified. Small gallstones in the gallbladder. No gallbladder wall thickening or pericholecystic fluid. No biliary ductal dilatation. The common bile duct is patent to the ampulla without evidence of filling defect. Pancreas: No mass, inflammatory changes, or other parenchymal abnormality identified. Spleen:  Splenomegaly, maximum coronal span 19.2 cm. Adrenals/Urinary Tract: No masses identified. No evidence of hydronephrosis. Stomach/Bowel: Incidental small diverticulum of the descending duodenum. Visualized portions within the abdomen are otherwise unremarkable. Vascular/Lymphatic: No pathologically enlarged lymph nodes identified. No abdominal aortic aneurysm demonstrated. Other:  None. Musculoskeletal: No suspicious bone lesions identified. IMPRESSION: 1. Cholelithiasis without evidence of acute cholecystitis or choledocholithiasis. 2. The common bile duct is patent to the ampulla. No biliary ductal dilatation. 3. Hepatic steatosis. 4. Splenomegaly. Electronically Signed   By: Lauralyn Primes M.D.   On: 01/14/2021 15:52   US Abdomen Limited RUQ (LIVER/GB)  Result Date: 01/13/2021 CLINICAL DATA:  Abdominal pain for 5 days. Elevated liver enzymes. Jaundice. EXAM: ULTRASOUND ABDOMEN LIMITED RIGHT UPPER QUADRANT COMPARISON:  None. FINDINGS: Gallbladder: Filling defects are seen in the dependent portion of the gallbladder, likely debris adhered to the wall the gallbladder. No wall thickening, pericholecystic fluid, stones, or Murphy's sign. Common bile duct:  Diameter: 4.3 mm Liver: Marked increased heterogeneous echotexture. No focal mass. Portal vein is patent on color Doppler imaging with normal direction of blood flow towards the liver. Other: None. IMPRESSION: 1. The filling defects along the posterior wall of the gallbladder are favored to represent tumefactive sludge. The gallbladder is otherwise normal. 2. No biliary duct dilatation identified. 3. Marked increased echogenicity in the liver is nonspecific but often seen with hepatic steatosis. Electronically Signed   By: Gerome Sam III M.D   On: 01/13/2021 11:59   MRCP.   Subjective: Patient was seen and examined, denies any withdrawal symptoms, feels better and wants to be discharged.  Discharge Exam: Vitals:   01/14/21 2122 01/15/21 0506  BP: (!) 148/89 (!) 137/93  Pulse: 79 62  Resp: 16 16  Temp: 99.5 F (37.5 C) 98.1 F (36.7 C)  SpO2: 97% 98%   Vitals:   01/14/21 0107 01/14/21 0505 01/14/21 2122 01/15/21 0506  BP: 129/80 136/84 (!) 148/89 (!) 137/93  Pulse: 75 79 79 62  Resp: 19 (!) Temp: 98.6 F (37 C) 98.6 F (37 C) 99.5 F (37.5 C) 98.1 F (36.7 C)  TempSrc: Oral Oral Oral Oral  SpO2: 97% 94% 97% 98%  Weight:      Height:        General: Pt is alert, awake, not in acute distress Cardiovascular: RRR, S1/S2 +, no rubs, no gallops Respiratory: CTA bilaterally, no wheezing, no rhonchi Abdominal: Soft, NT, ND, bowel sounds + Extremities: no edema, no cyanosis    The results of significant diagnostics from this hospitalization (including  imaging, microbiology, ancillary and laboratory) are listed below for reference.     Microbiology: Recent Results (from the past 240 hour(s))  Resp Panel by RT-PCR (Flu A&B, Covid) Nasopharyngeal Swab     Status: None   Collection Time: 01/13/21 10:55 AM   Specimen: Nasopharyngeal Swab; Nasopharyngeal(NP) swabs in vial transport medium  Result Value Ref Range Status   SARS Coronavirus 2 by RT PCR NEGATIVE  NEGATIVE Final    Comment: (NOTE) SARS-CoV-2 target nucleic acids are NOT DETECTED.  The SARS-CoV-2 RNA is generally detectable in upper respiratory specimens during the acute phase of infection. The lowest concentration of SARS-CoV-2 viral copies this assay can detect is 138 copies/mL. A negative result does not preclude SARS-Cov-2 infection and should not be used as the sole basis for treatment or other patient management decisions. A negative result may occur with  improper specimen collection/handling, submission of specimen other than nasopharyngeal swab, presence of viral mutation(s) within the areas targeted by this assay, and inadequate number of viral copies(<138 copies/mL). A negative result must be combined with clinical observations, patient history, and epidemiological information. The expected result is Negative.  Fact Sheet for Patients:  BloggerCourse.comhttps://www.fda.gov/media/152166/download  Fact Sheet for Healthcare Providers:  SeriousBroker.ithttps://www.fda.gov/media/152162/download  This test is no t yet approved or cleared by the [place] FDA and  has been authorized for detection and/or diagnosis of SARS-CoV-2 by FDA under an Emergency Use Authorization (EUA). This EUA will remain  in effect (meaning this test can be used) for the duration of the COVID-19 declaration under Section 564(b)(1) of the Act, 21 U.S.C.section 360bbb-3(b)(1), unless the authorization is terminated  or revoked sooner.       Influenza A by PCR NEGATIVE NEGATIVE Final   Influenza B by PCR NEGATIVE NEGATIVE Final    Comment: (NOTE) The Xpert Xpress SARS-CoV-2/FLU/RSV plus assay is intended as an aid in the diagnosis of influenza from Nasopharyngeal swab specimens and should not be used as a sole basis for treatment. Nasal washings and aspirates are unacceptable for Xpert Xpress SARS-CoV-2/FLU/RSV testing.  Fact Sheet for Patients: BloggerCourse.comhttps://www.fda.gov/media/152166/download  Fact Sheet for Healthcare  Providers: SeriousBroker.ithttps://www.fda.gov/media/152162/download  This test is not yet approved or cleared by the [place] FDA and has been authorized for detection and/or diagnosis of SARS-CoV-2 by FDA under an Emergency Use Authorization (EUA). This EUA will remain in effect (meaning this test can be used) for the duration of the COVID-19 declaration under Section 564(b)(1) of the Act, 21 U.S.C. section 360bbb-3(b)(1), unless the authorization is terminated or revoked.  Performed at [facility], [address], [place], [place] [address]   Culture, blood (routine x 2)     Status: None (Preliminary result)   Collection Time: 01/13/21  6:46 PM   Specimen: BLOOD RIGHT HAND  Result Value Ref Range Status   Specimen Description   Final    BLOOD RIGHT HAND Performed at [facility], [address] [place], [place] [address]    Special Requests   Final    BOTTLES DRAWN AEROBIC ONLY Blood Culture adequate volume   Culture   Final    NO GROWTH < 24 HOURS Performed at [facility], [address] [place], [place] [address]    Report Status PENDING  Incomplete     Labs: BNP (last 3 results) No results for input(s): BNP in the last 8760 hours. Basic Metabolic Panel: Recent Labs  Lab 01/13/21 1040 01/13/21 1845 01/14/21 0551 01/15/21 0544  NA 137  --  133* 138  K 3.7  --  3.4* 3.7  CL 101  --  104 106  CO2 26  --  23 26  GLUCOSE 110*  --  157* 144*  BUN 18  --  15 14  CREATININE 1.33*  --  1.00 0.96  CALCIUM 9.3  --  8.4* 8.7*  MG  --  2.2  --  2.1  PHOS  --  2.5  --  2.8   Liver Function Tests: Recent Labs  Lab 01/13/21 1040 01/13/21 1845 01/14/21 0551 01/15/21 0544  AST 155*  --  88* 81*  ALT 350*  --  228* 202*  ALKPHOS 62  --  48 48  BILITOT 7.5* 5.2* 4.4* 2.8*  PROT 7.7  --  6.5 6.8  ALBUMIN 4.4  --  3.3* 3.3*   Recent Labs  Lab 01/13/21 1040  LIPASE 32   No results for input(s): AMMONIA in the last 168  hours. CBC: Recent Labs  Lab 01/13/21 1040 01/14/21 0551 01/15/21 0544  WBC 7.2 4.0 3.6*  NEUTROABS 6.5  --   --   HGB 16.7 13.7 14.2  HCT 48.0 39.7 41.4  MCV 89.4 90.0 90.6  PLT 121* 112* 123*   Cardiac Enzymes: No results for input(s): CKTOTAL, CKMB, CKMBINDEX, TROPONINI in the last 168 hours. BNP: Invalid input(s): POCBNP CBG: No results for input(s): GLUCAP in the last 168 hours. D-Dimer No results for input(s): DDIMER in the last 72 hours. Hgb A1c No results for input(s): HGBA1C in the last 72 hours. Lipid Profile No results for input(s): CHOL, HDL, LDLCALC, TRIG, CHOLHDL, LDLDIRECT in the last 72 hours. Thyroid function studies No results for input(s): TSH, T4TOTAL, T3FREE, THYROIDAB in the last 72 hours.  Invalid input(s): FREET3 Anemia work up No results for input(s): VITAMINB12, FOLATE, FERRITIN, TIBC, IRON, RETICCTPCT in the last 72 hours. Urinalysis    Component Value Date/Time   COLORURINE AMBER (A) 01/13/2021 2100   APPEARANCEUR HAZY (A) 01/13/2021 2100   LABSPEC 1.030 01/13/2021 2100   PHURINE 5.0 01/13/2021 2100   GLUCOSEU 50 (A) 01/13/2021 2100   HGBUR SMALL (A) 01/13/2021 2100   BILIRUBINUR MODERATE (A) 01/13/2021 2100   KETONESUR 5 (A) 01/13/2021 2100   PROTEINUR 100 (A) 01/13/2021 2100   NITRITE NEGATIVE 01/13/2021 2100   LEUKOCYTESUR NEGATIVE 01/13/2021 2100   Sepsis Labs Invalid input(s): PROCALCITONIN,  WBC,  LACTICIDVEN Microbiology Recent Results (from the past 240 hour(s))  Resp Panel by RT-PCR (Flu A&B, Covid) Nasopharyngeal Swab     Status: None   Collection Time: 01/13/21 10:55 AM   Specimen: Nasopharyngeal Swab; Nasopharyngeal(NP) swabs in vial transport medium  Result Value Ref Range Status   SARS Coronavirus 2 by RT PCR NEGATIVE NEGATIVE Final    Comment: (NOTE) SARS-CoV-2 target nucleic acids are NOT DETECTED.  The SARS-CoV-2 RNA is generally detectable in upper respiratory specimens during the acute phase of infection. The  lowest concentration of SARS-CoV-2 viral copies this assay can detect is 138 copies/mL. A negative result does not preclude SARS-Cov-2 infection and should not be used as the sole basis for treatment or other patient management decisions. A negative result may occur with  improper specimen collection/handling, submission of specimen other than nasopharyngeal swab, presence of viral mutation(s) within the areas targeted by this assay, and inadequate number of viral copies(<138 copies/mL). A negative result must be combined with clinical observations, patient history, and epidemiological information. The expected result is Negative.  Fact Sheet for Patients:  BloggerCourse.com  Fact Sheet for Healthcare Providers:  SeriousBroker.it  This test is no t yet approved or cleared by the [place] FDA and  has been authorized for detection and/or diagnosis of SARS-CoV-2 by FDA under an Emergency Use Authorization (EUA). This EUA will remain  in effect (meaning this test can be used) for the duration of the COVID-19 declaration under Section 564(b)(1) of the Act, 21 U.S.C.section 360bbb-3(b)(1), unless the authorization is terminated  or revoked sooner.       Influenza A by PCR NEGATIVE NEGATIVE Final   Influenza B by PCR NEGATIVE NEGATIVE Final    Comment: (NOTE) The Xpert Xpress SARS-CoV-2/FLU/RSV plus assay is intended as an aid in the diagnosis of influenza from Nasopharyngeal swab specimens and should not be used as a sole basis for treatment. Nasal washings and aspirates are unacceptable for Xpert Xpress SARS-CoV-2/FLU/RSV testing.  Fact Sheet for Patients: BloggerCourse.com  Fact Sheet for Healthcare Providers: SeriousBroker.it  This test is not yet approved or cleared by the [place] FDA and has been authorized for detection and/or diagnosis of SARS-CoV-2 by FDA under  an Emergency Use Authorization (EUA). This EUA will remain in effect (meaning this test can be used) for the duration of the COVID-19 declaration under Section 564(b)(1) of the Act, 21 U.S.C. section 360bbb-3(b)(1), unless the authorization is terminated or revoked.  Performed at [facility], [address], [place], [place] [address]   Culture, blood (routine x 2)     Status: None (Preliminary result)   Collection Time: 01/13/21  6:46 PM   Specimen: BLOOD RIGHT HAND  Result Value Ref Range Status   Specimen Description   Final    BLOOD RIGHT HAND Performed at [facility], [address], [place], [place] [address]    Special Requests   Final    BOTTLES DRAWN AEROBIC ONLY Blood Culture adequate volume   Culture   Final    NO GROWTH < 24 HOURS Performed at [facility], [address] [place], [place] [address]    Report Status PENDING  Incomplete     Time coordinating discharge: Over 30 minutes  SIGNED:   Cipriano Bunker, MD  [facility] 01/15/2021, 9:19 AM Pager   If 7PM-7AM, please contact night-coverage [email or web address]

## 2021-01-15 NOTE — Progress Notes (Signed)
Mellody Life 9:10 AM  Subjective: Patient seen and examined and case discussed with my partner Dr. 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 Bosie Clos for this or the constipation  Objective: Vital signs stable afebrile no acute distress abdomen is soft nontender x-rays reviewed BUN and creatinine okay LFTs slight decrease CBC okay  Assessment: Elevated liver tests questionable etiology  Plan: Agree with close liver tests follow-up as an outpatient Dr. Bosie Clos happy to see back and please call us if we can be of any further assistance with this hospital stay and suggested MiraLAX use at home as above  [facility]  office [phone] After 5PM or if no answer call [phone]

## 2021-01-15 NOTE — Discharge Instructions (Signed)
Advised to follow up PCP in one week. Advised to follow up GI Dr. Bosie Clos in 2 weeks. Advised to avoid alcohol for 3-4 weeks.

## 2021-01-18 LAB — CULTURE, BLOOD (ROUTINE X 2)
Culture: NO GROWTH
Special Requests: ADEQUATE

## 2021-01-19 LAB — CULTURE, BLOOD (ROUTINE X 2)
Culture: NO GROWTH
Special Requests: ADEQUATE

## 2021-07-18 IMAGING — CT CT SHOULDER*L* W/O CM
3 of 4 series · 11 of 34 positions shown, 13 images · non-contrast
Comparison: Chest x-ray 03/30/2020

CLINICAL DATA: Shoulder dislocation post reduction. History of
recurrent dislocations.

EXAM:
CT OF THE UPPER LEFT EXTREMITY WITHOUT CONTRAST
TECHNIQUE: Multidetector CT imaging of the upper left extremity was performed
according to the standard protocol.

[Series 7: sag st · sagittal · 0.50mm/px · 5 of 153 slices shown, 6 images]
[im 51/153  bone]
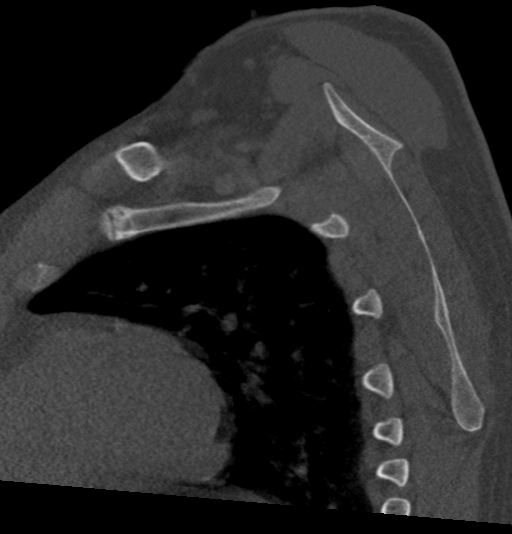
[im 64/153  bone]
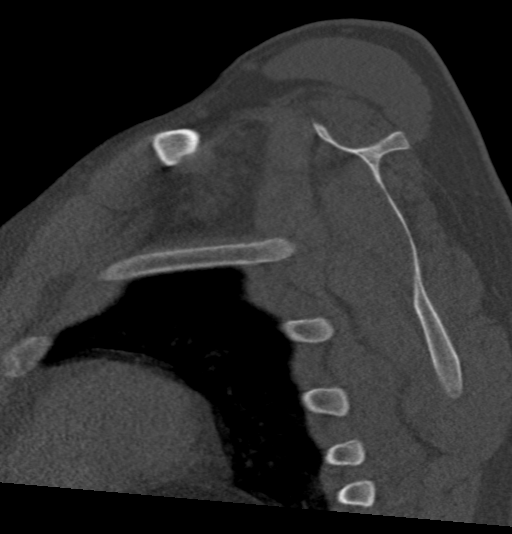
[im 77/153  soft-tissue]
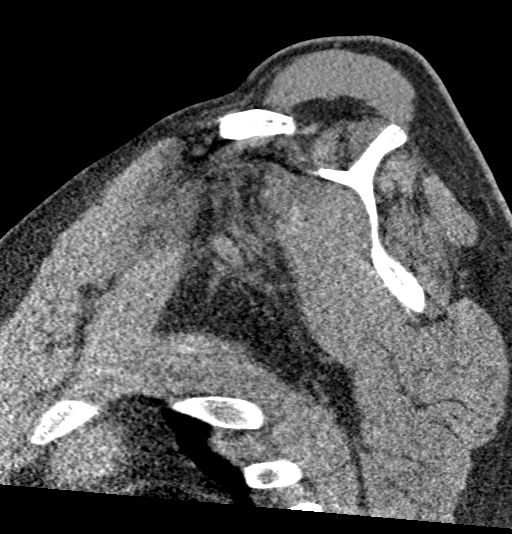
[im 77/153  bone]
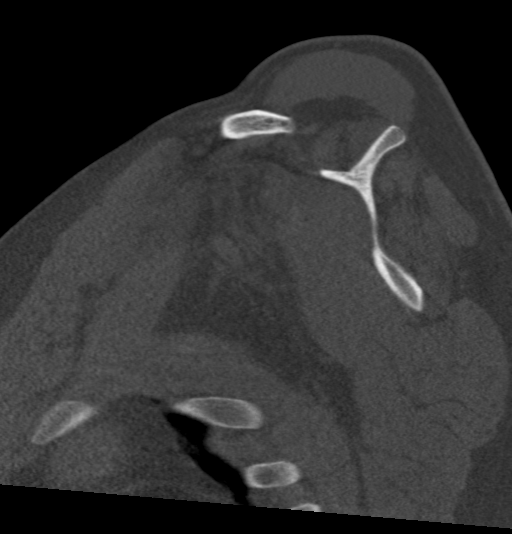
[im 89/153  bone]
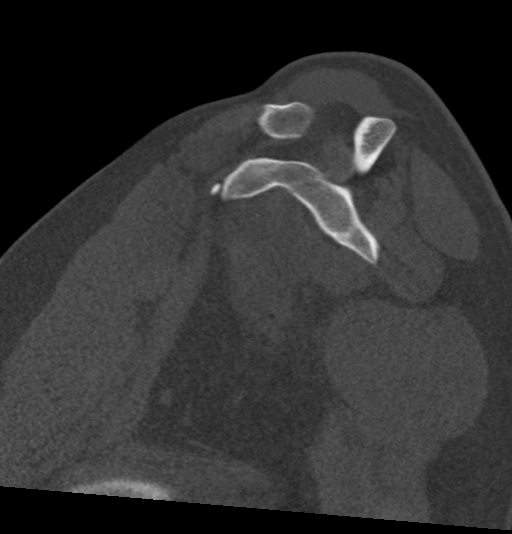
[im 102/153  bone]
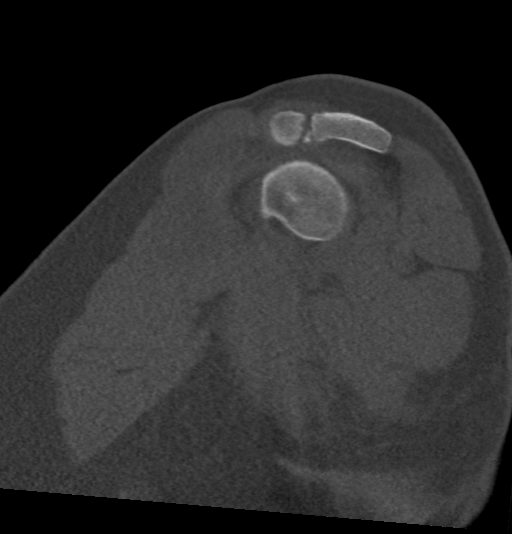

[Series 8: cor st · coronal · 0.47mm/px · 3 of 101 slices shown]
[im 23/101  bone]
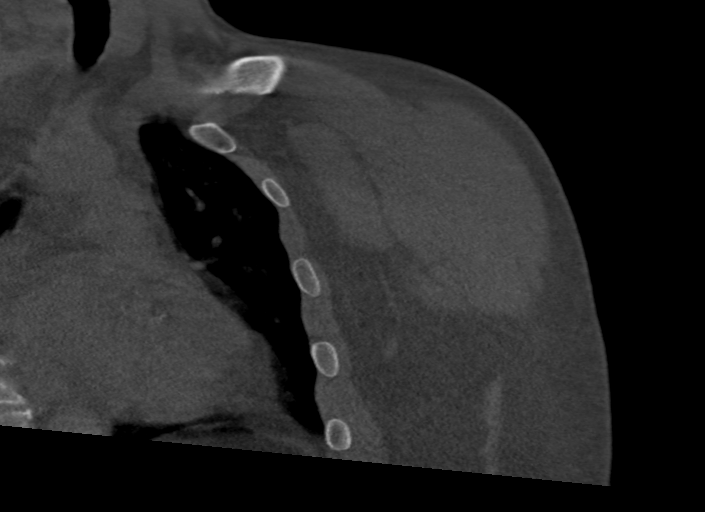
[im 41/101  bone]
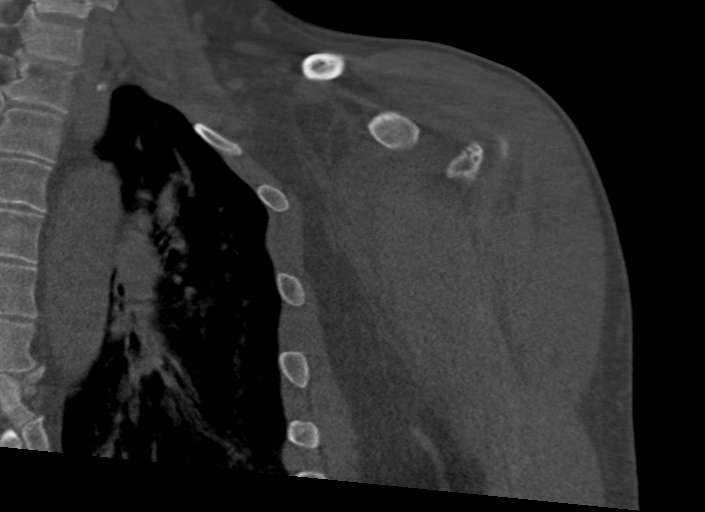
[im 60/101  bone]
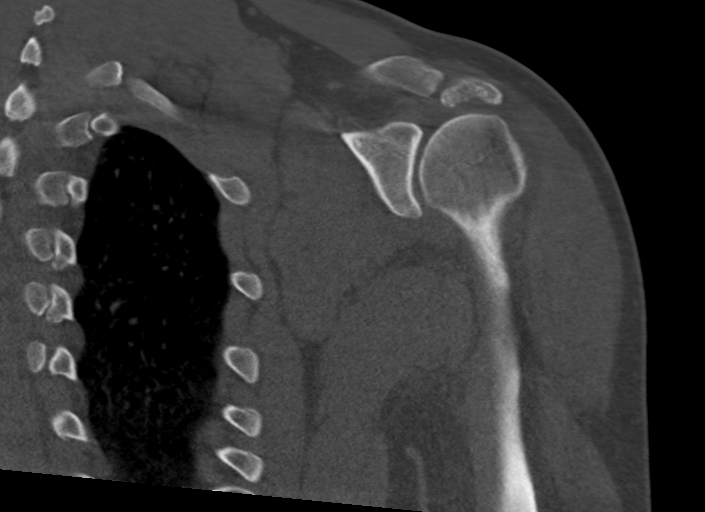

[Series 9: ax st · axial · 0.49mm/px · z∈[-464,-228]mm · 3 of 125 slices shown, 4 images]
[im 1/125  soft-tissue]
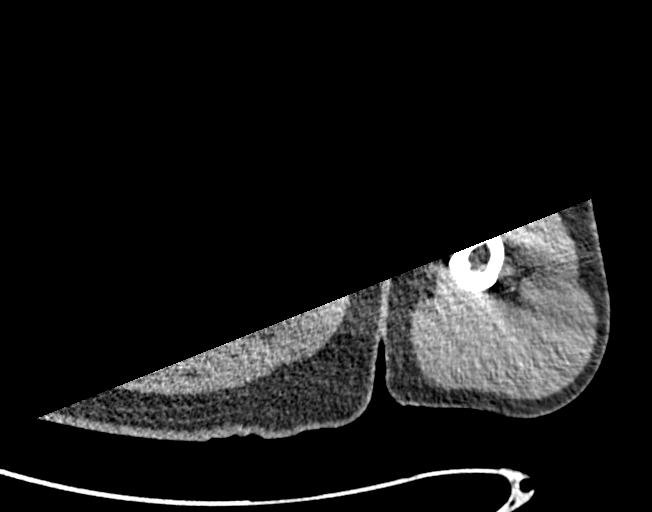
[im 1/125  bone]
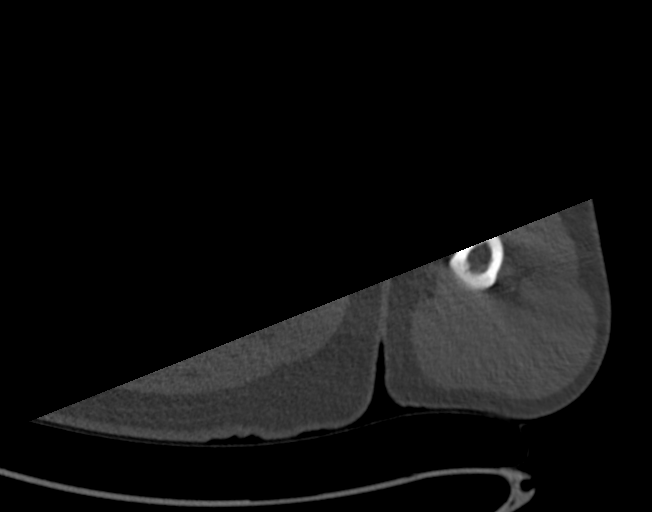
[im 63/125  bone]
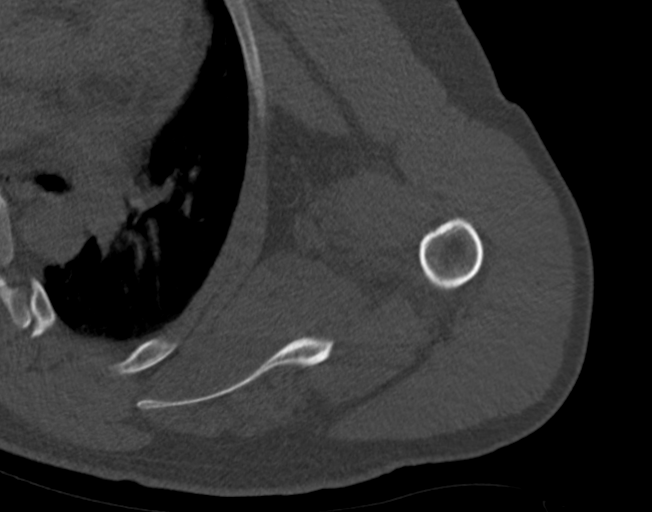
[im 125/125  bone]
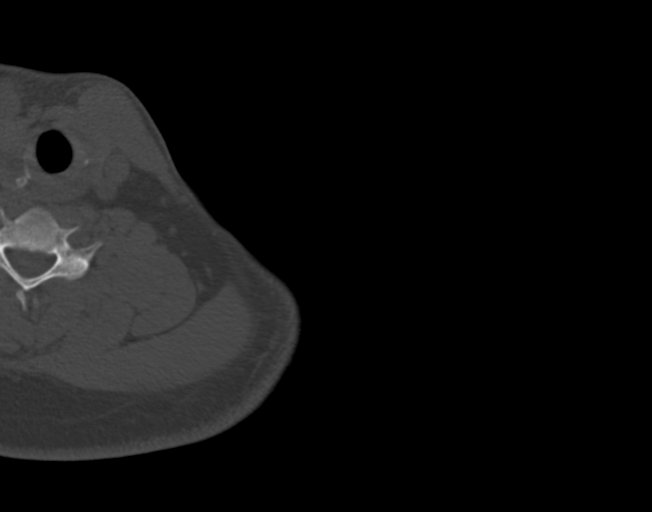

[11 of 34 positions shown; findings below may reference images not displayed]

FINDINGS: The humeral head is normally located in the glenoid fossa. No acute
fracture is identified. There is a shallow Hill-Sachs impaction type
defect involving the humeral head which appears chronic. No bony
Bankart fracture of the glenoid is identified. No significant
degenerative changes involving the glenohumeral joint.

Corticated densities are noted along the posterior aspect of the
humeral head. These are most likely calcifications in the
infraspinatus tendon suggesting calcific tendinitis.

The AC joint is intact. Os acromial noted. The acromion is type 1-2
in shape. No significant lateral downsloping or undersurface
spurring. Mild narrowing of the humeroacromial space. Suspect
supraspinatus and infraspinatus tendon tears with mild fatty atrophy
of the muscles. MRI suggested for further evaluation.

The left ribs are intact and the visualized left lung is grossly
clear. The heart appears enlarged and there are age advanced
coronary artery calcifications noted.
IMPRESSION: 1. Shallow Hill-Sachs impaction type defect involving the humeral
head appears chronic. No bony Bankart fracture of the glenoid is
identified.
2. Corticated densities along the posterior aspect of the humeral
head are most likely calcifications in the infraspinatus tendon
suggesting calcific tendinitis.
3. Suspect chronic supraspinatus and infraspinatus tendon tears with
fatty atrophy of the muscles. MRI suggested for further evaluation.
4. Age advanced coronary artery calcifications.

Aortic Atherosclerosis ([id number]).

## 2024-06-24 ENCOUNTER — Other Ambulatory Visit (HOSPITAL_BASED_OUTPATIENT_CLINIC_OR_DEPARTMENT_OTHER): Payer: Self-pay | Admitting: Family Medicine

## 2024-06-24 DIAGNOSIS — Z8249 Family history of ischemic heart disease and other diseases of the circulatory system: Secondary | ICD-10-CM

## 2024-07-01 ENCOUNTER — Ambulatory Visit (HOSPITAL_BASED_OUTPATIENT_CLINIC_OR_DEPARTMENT_OTHER)
Admission: RE | Admit: 2024-07-01 | Discharge: 2024-07-01 | Disposition: A | Discharge status: Home / Self Care | Payer: Self-pay | Source: Ambulatory Visit | Attending: Family Medicine | Admitting: Family Medicine

## 2024-07-01 DIAGNOSIS — Z8249 Family history of ischemic heart disease and other diseases of the circulatory system: Secondary | ICD-10-CM
